# Patient Record
Sex: Female | Born: 1945
Health system: Southern US, Community
[De-identification: ages and names within clinical notes are randomized; demographics above are authoritative.]

## PROBLEM LIST (undated history)

## (undated) DIAGNOSIS — R011 Cardiac murmur, unspecified: Secondary | ICD-10-CM

## (undated) DIAGNOSIS — E785 Hyperlipidemia, unspecified: Secondary | ICD-10-CM

## (undated) DIAGNOSIS — Z9289 Personal history of other medical treatment: Secondary | ICD-10-CM

## (undated) DIAGNOSIS — M8580 Other specified disorders of bone density and structure, unspecified site: Secondary | ICD-10-CM

## (undated) DIAGNOSIS — M1990 Unspecified osteoarthritis, unspecified site: Secondary | ICD-10-CM

## (undated) DIAGNOSIS — T7840XA Allergy, unspecified, initial encounter: Secondary | ICD-10-CM

## (undated) DIAGNOSIS — I10 Essential (primary) hypertension: Secondary | ICD-10-CM

## (undated) HISTORY — DX: Personal history of other medical treatment: Z92.89

## (undated) HISTORY — DX: Essential (primary) hypertension: I10

## (undated) HISTORY — DX: Hyperlipidemia, unspecified: E78.5

## (undated) HISTORY — DX: Cardiac murmur, unspecified: R01.1

## (undated) HISTORY — DX: Unspecified osteoarthritis, unspecified site: M19.90

## (undated) HISTORY — DX: Other specified disorders of bone density and structure, unspecified site: M85.80

## (undated) HISTORY — PX: COLONOSCOPY: SHX174

## (undated) HISTORY — DX: Allergy, unspecified, initial encounter: T78.40XA

---

## 1968-08-15 DIAGNOSIS — Z9289 Personal history of other medical treatment: Secondary | ICD-10-CM

## 1968-08-15 HISTORY — DX: Personal history of other medical treatment: Z92.89

## 1977-08-15 HISTORY — PX: TUBAL LIGATION: SHX77

## 1998-10-26 ENCOUNTER — Encounter: Admission: RE | Admit: 1998-10-26 | Discharge: 1998-10-26 | Payer: Self-pay | Admitting: Family Medicine

## 1999-10-14 ENCOUNTER — Encounter: Admission: RE | Admit: 1999-10-14 | Discharge: 1999-10-14 | Payer: Self-pay | Admitting: Family Medicine

## 1999-11-15 ENCOUNTER — Encounter: Admission: RE | Admit: 1999-11-15 | Discharge: 1999-11-15 | Payer: Self-pay | Admitting: Family Medicine

## 1999-12-30 ENCOUNTER — Encounter: Admission: RE | Admit: 1999-12-30 | Discharge: 1999-12-30 | Payer: Self-pay | Admitting: *Deleted

## 2000-01-03 ENCOUNTER — Encounter: Admission: RE | Admit: 2000-01-03 | Discharge: 2000-01-03 | Payer: Self-pay | Admitting: Family Medicine

## 2000-01-03 ENCOUNTER — Encounter: Payer: Self-pay | Admitting: Family Medicine

## 2000-12-27 ENCOUNTER — Encounter: Admission: RE | Admit: 2000-12-27 | Discharge: 2000-12-27 | Payer: Self-pay | Admitting: Family Medicine

## 2001-01-10 ENCOUNTER — Encounter: Admission: RE | Admit: 2001-01-10 | Discharge: 2001-01-10 | Payer: Self-pay | Admitting: Family Medicine

## 2001-01-10 ENCOUNTER — Encounter: Payer: Self-pay | Admitting: Sports Medicine

## 2002-01-11 ENCOUNTER — Encounter: Admission: RE | Admit: 2002-01-11 | Discharge: 2002-01-11 | Payer: Self-pay | Admitting: Family Medicine

## 2002-01-15 ENCOUNTER — Encounter: Admission: RE | Admit: 2002-01-15 | Discharge: 2002-01-15 | Payer: Self-pay | Admitting: Family Medicine

## 2002-01-15 ENCOUNTER — Ambulatory Visit (HOSPITAL_COMMUNITY): Admission: RE | Admit: 2002-01-15 | Discharge: 2002-01-15 | Payer: Self-pay | Admitting: Family Medicine

## 2002-02-07 ENCOUNTER — Encounter: Admission: RE | Admit: 2002-02-07 | Discharge: 2002-02-07 | Payer: Self-pay | Admitting: Sports Medicine

## 2002-02-07 ENCOUNTER — Encounter: Payer: Self-pay | Admitting: Sports Medicine

## 2002-03-14 ENCOUNTER — Encounter: Admission: RE | Admit: 2002-03-14 | Discharge: 2002-03-14 | Payer: Self-pay | Admitting: Family Medicine

## 2003-02-26 ENCOUNTER — Encounter: Admission: RE | Admit: 2003-02-26 | Discharge: 2003-02-26 | Payer: Self-pay | Admitting: Family Medicine

## 2003-02-26 ENCOUNTER — Encounter: Payer: Self-pay | Admitting: Family Medicine

## 2003-03-05 ENCOUNTER — Encounter: Admission: RE | Admit: 2003-03-05 | Discharge: 2003-03-05 | Payer: Self-pay | Admitting: Sports Medicine

## 2003-03-05 ENCOUNTER — Encounter: Payer: Self-pay | Admitting: Sports Medicine

## 2005-12-16 ENCOUNTER — Ambulatory Visit: Payer: Self-pay | Admitting: Family Medicine

## 2006-03-22 ENCOUNTER — Other Ambulatory Visit: Admission: RE | Admit: 2006-03-22 | Discharge: 2006-03-22 | Payer: Self-pay | Admitting: Family Medicine

## 2006-03-22 ENCOUNTER — Encounter (INDEPENDENT_AMBULATORY_CARE_PROVIDER_SITE_OTHER): Payer: Self-pay | Admitting: Specialist

## 2006-03-22 ENCOUNTER — Ambulatory Visit: Payer: Self-pay | Admitting: Family Medicine

## 2006-03-22 LAB — CONVERTED CEMR LAB: Pap Smear: NORMAL

## 2006-04-04 ENCOUNTER — Encounter: Admission: RE | Admit: 2006-04-04 | Discharge: 2006-04-04 | Payer: Self-pay | Admitting: Family Medicine

## 2006-04-20 ENCOUNTER — Ambulatory Visit: Payer: Self-pay | Admitting: Family Medicine

## 2006-06-12 ENCOUNTER — Ambulatory Visit: Payer: Self-pay | Admitting: Family Medicine

## 2007-01-29 ENCOUNTER — Encounter: Payer: Self-pay | Admitting: Family Medicine

## 2007-04-09 ENCOUNTER — Encounter: Admission: RE | Admit: 2007-04-09 | Discharge: 2007-04-09 | Payer: Self-pay | Admitting: Family Medicine

## 2007-04-11 ENCOUNTER — Encounter (INDEPENDENT_AMBULATORY_CARE_PROVIDER_SITE_OTHER): Payer: Self-pay | Admitting: *Deleted

## 2007-04-17 ENCOUNTER — Encounter: Payer: Self-pay | Admitting: Family Medicine

## 2007-04-17 DIAGNOSIS — R011 Cardiac murmur, unspecified: Secondary | ICD-10-CM

## 2007-04-17 DIAGNOSIS — M8580 Other specified disorders of bone density and structure, unspecified site: Secondary | ICD-10-CM

## 2007-04-17 DIAGNOSIS — Z87898 Personal history of other specified conditions: Secondary | ICD-10-CM

## 2007-04-17 DIAGNOSIS — J309 Allergic rhinitis, unspecified: Secondary | ICD-10-CM | POA: Insufficient documentation

## 2007-05-18 ENCOUNTER — Ambulatory Visit: Payer: Self-pay | Admitting: Family Medicine

## 2007-05-21 LAB — CONVERTED CEMR LAB
ALT: 15 units/L (ref 0–35)
AST: 19 units/L (ref 0–37)
Albumin: 4.6 g/dL (ref 3.5–5.2)
Alkaline Phosphatase: 45 units/L (ref 39–117)
Basophils Absolute: 0 10*3/uL (ref 0.0–0.1)
Basophils Relative: 0.7 % (ref 0.0–1.0)
Bilirubin, Direct: 0.1 mg/dL (ref 0.0–0.3)
Chloride: 106 meq/L (ref 96–112)
Eosinophils Absolute: 0 10*3/uL (ref 0.0–0.6)
Eosinophils Relative: 0.8 % (ref 0.0–5.0)
GFR calc Af Amer: 94 mL/min
GFR calc non Af Amer: 78 mL/min
HCT: 36.6 % (ref 36.0–46.0)
HDL: 46.6 mg/dL (ref >39.0)
Lymphocytes Relative: 35 % (ref 12.0–46.0)
MCHC: 34.4 g/dL (ref 30.0–36.0)
MCV: 90.1 fL (ref 78.0–100.0)
Monocytes Absolute: 0.3 10*3/uL (ref 0.2–0.7)
Neutro Abs: 1.9 10*3/uL (ref 1.4–7.7)
Neutrophils Relative %: 53.5 % (ref 43.0–77.0)
RBC: 4.06 M/uL (ref 3.87–5.11)
Sodium: 143 meq/L (ref 135–145)
Total CHOL/HDL Ratio: 4.5
Triglycerides: 108 mg/dL (ref 0–149)
VLDL: 22 mg/dL (ref 0–40)

## 2007-06-05 ENCOUNTER — Ambulatory Visit: Payer: Self-pay | Admitting: Family Medicine

## 2007-06-06 ENCOUNTER — Encounter (INDEPENDENT_AMBULATORY_CARE_PROVIDER_SITE_OTHER): Payer: Self-pay | Admitting: *Deleted

## 2008-04-28 ENCOUNTER — Encounter: Admission: RE | Admit: 2008-04-28 | Discharge: 2008-04-28 | Payer: Self-pay | Admitting: Family Medicine

## 2008-06-24 ENCOUNTER — Ambulatory Visit: Payer: Self-pay | Admitting: Family Medicine

## 2008-06-24 DIAGNOSIS — E785 Hyperlipidemia, unspecified: Secondary | ICD-10-CM

## 2008-06-27 ENCOUNTER — Encounter: Payer: Self-pay | Admitting: Family Medicine

## 2008-06-27 ENCOUNTER — Encounter: Admission: RE | Admit: 2008-06-27 | Discharge: 2008-06-27 | Payer: Self-pay | Admitting: Family Medicine

## 2008-07-01 ENCOUNTER — Ambulatory Visit: Payer: Self-pay | Admitting: Family Medicine

## 2008-07-02 LAB — CONVERTED CEMR LAB
Alkaline Phosphatase: 29 units/L — ABNORMAL LOW (ref 39–117)
BUN: 16 mg/dL (ref 6–23)
Basophils Absolute: 0 10*3/uL (ref 0.0–0.1)
Basophils Relative: 0.4 % (ref 0.0–3.0)
Calcium: 9.3 mg/dL (ref 8.4–10.5)
Chloride: 108 meq/L (ref 96–112)
Cholesterol: 201 mg/dL (ref 0–200)
Direct LDL: 121.5 mg/dL
Eosinophils Relative: 1.7 % (ref 0.0–5.0)
GFR calc non Af Amer: 68 mL/min
Lymphocytes Relative: 44.2 % (ref 12.0–46.0)
MCV: 92.6 fL (ref 78.0–100.0)
Monocytes Absolute: 0.4 10*3/uL (ref 0.1–1.0)
Neutrophils Relative %: 41.8 % — ABNORMAL LOW (ref 43.0–77.0)
RDW: 12.5 % (ref 11.5–14.6)
TSH: 1.25 microintl units/mL (ref 0.35–5.50)
Total Bilirubin: 0.8 mg/dL (ref 0.3–1.2)
VLDL: 18 mg/dL (ref 0–40)

## 2008-07-04 ENCOUNTER — Ambulatory Visit: Payer: Self-pay | Admitting: Family Medicine

## 2009-06-03 ENCOUNTER — Encounter: Admission: RE | Admit: 2009-06-03 | Discharge: 2009-06-03 | Payer: Self-pay | Admitting: Family Medicine

## 2009-06-05 ENCOUNTER — Encounter (INDEPENDENT_AMBULATORY_CARE_PROVIDER_SITE_OTHER): Payer: Self-pay | Admitting: *Deleted

## 2009-07-15 ENCOUNTER — Other Ambulatory Visit: Admission: RE | Admit: 2009-07-15 | Discharge: 2009-07-15 | Payer: Self-pay | Admitting: Family Medicine

## 2009-07-15 ENCOUNTER — Ambulatory Visit: Payer: Self-pay | Admitting: Family Medicine

## 2009-07-31 ENCOUNTER — Encounter: Payer: Self-pay | Admitting: Family Medicine

## 2009-08-12 ENCOUNTER — Encounter (INDEPENDENT_AMBULATORY_CARE_PROVIDER_SITE_OTHER): Payer: Self-pay | Admitting: *Deleted

## 2010-06-07 ENCOUNTER — Encounter: Admission: RE | Admit: 2010-06-07 | Discharge: 2010-06-07 | Payer: Self-pay | Admitting: Family Medicine

## 2010-06-07 LAB — HM MAMMOGRAPHY: HM Mammogram: NEGATIVE

## 2010-06-09 ENCOUNTER — Encounter (INDEPENDENT_AMBULATORY_CARE_PROVIDER_SITE_OTHER): Payer: Self-pay | Admitting: *Deleted

## 2010-07-15 ENCOUNTER — Ambulatory Visit: Payer: Self-pay | Admitting: Oncology

## 2010-07-16 ENCOUNTER — Telehealth (INDEPENDENT_AMBULATORY_CARE_PROVIDER_SITE_OTHER): Payer: Self-pay | Admitting: *Deleted

## 2010-07-20 ENCOUNTER — Ambulatory Visit: Payer: Self-pay | Admitting: Family Medicine

## 2010-07-20 LAB — CONVERTED CEMR LAB
ALT: 16 units/L (ref 0–35)
AST: 25 units/L (ref 0–37)
BUN: 20 mg/dL (ref 6–23)
Chloride: 104 meq/L (ref 96–112)
Cholesterol: 212 mg/dL — ABNORMAL HIGH (ref 0–200)
Creatinine, Ser: 0.8 mg/dL (ref 0.4–1.2)
Eosinophils Absolute: 0.1 10*3/uL (ref 0.0–0.7)
Eosinophils Relative: 2 % (ref 0–5)
GFR calc non Af Amer: 81.44 mL/min (ref >60.00)
HDL: 43.4 mg/dL (ref >39.00)
Lymphocytes Relative: 61 % — ABNORMAL HIGH (ref 12–46)
MCHC: 33.1 g/dL (ref 30.0–36.0)
Monocytes Relative: 12 % (ref 3–12)
Platelets: 292 10*3/uL (ref 150–400)
Potassium: 4.6 meq/L (ref 3.5–5.1)
RDW: 13.8 % (ref 11.5–15.5)
Sodium: 139 meq/L (ref 135–145)
TSH: 1.35 microintl units/mL (ref 0.35–5.50)
Total Bilirubin: 0.5 mg/dL (ref 0.3–1.2)
VLDL: 29.8 mg/dL (ref 0.0–40.0)

## 2010-07-21 ENCOUNTER — Telehealth: Payer: Self-pay | Admitting: Family Medicine

## 2010-07-23 ENCOUNTER — Ambulatory Visit: Payer: Self-pay | Admitting: Family Medicine

## 2010-07-23 DIAGNOSIS — D709 Neutropenia, unspecified: Secondary | ICD-10-CM

## 2010-07-29 ENCOUNTER — Encounter: Payer: Self-pay | Admitting: Family Medicine

## 2010-07-29 ENCOUNTER — Ambulatory Visit: Payer: Self-pay | Admitting: Oncology

## 2010-08-15 ENCOUNTER — Ambulatory Visit: Payer: Self-pay | Admitting: Oncology

## 2010-08-20 ENCOUNTER — Other Ambulatory Visit: Payer: Self-pay | Admitting: Family Medicine

## 2010-08-20 ENCOUNTER — Ambulatory Visit
Admission: RE | Admit: 2010-08-20 | Discharge: 2010-08-20 | Payer: Self-pay | Source: Home / Self Care | Attending: Family Medicine | Admitting: Family Medicine

## 2010-08-20 LAB — FECAL OCCULT BLOOD, IMMUNOCHEMICAL: Fecal Occult Bld: NEGATIVE

## 2010-08-20 LAB — FECAL OCCULT BLOOD, GUAIAC: Fecal Occult Blood: NEGATIVE

## 2010-08-23 ENCOUNTER — Encounter: Payer: Self-pay | Admitting: Family Medicine

## 2010-09-04 ENCOUNTER — Other Ambulatory Visit: Payer: Self-pay | Admitting: Family Medicine

## 2010-09-04 DIAGNOSIS — Z780 Asymptomatic menopausal state: Secondary | ICD-10-CM

## 2010-09-12 LAB — CONVERTED CEMR LAB
ALT: 17 units/L (ref 0–35)
Albumin: 4.5 g/dL (ref 3.5–5.2)
Alkaline Phosphatase: 35 units/L — ABNORMAL LOW (ref 39–117)
Basophils Absolute: 0 10*3/uL (ref 0.0–0.1)
Cholesterol: 203 mg/dL — ABNORMAL HIGH (ref 0–200)
Direct LDL: 143.2 mg/dL
Eosinophils Absolute: 0 10*3/uL (ref 0.0–0.7)
Glucose, Bld: 97 mg/dL (ref 70–99)
HCT: 36.7 % (ref 36.0–46.0)
HDL: 49.2 mg/dL (ref >39.00)
Hemoglobin: 12.6 g/dL (ref 12.0–15.0)
Lymphs Abs: 1.5 10*3/uL (ref 0.7–4.0)
MCHC: 34.3 g/dL (ref 30.0–36.0)
MCV: 93.5 fL (ref 78.0–100.0)
Monocytes Absolute: 0.4 10*3/uL (ref 0.1–1.0)
Neutro Abs: 1.9 10*3/uL (ref 1.4–7.7)
Potassium: 4 meq/L (ref 3.5–5.1)
RBC: 3.93 M/uL (ref 3.87–5.11)
RDW: 12.5 % (ref 11.5–14.6)
Sodium: 139 meq/L (ref 135–145)
Total Bilirubin: 0.8 mg/dL (ref 0.3–1.2)
Total CHOL/HDL Ratio: 4
Triglycerides: 101 mg/dL (ref 0.0–149.0)

## 2010-09-14 NOTE — Progress Notes (Signed)
----   Converted from flag ---- ---- 07/15/2010 9:49 PM, Colon Flattery Tower MD wrote: please check wellness, lipid and vit D for v70.0 and 733.0 thanks  ---- 07/14/2010 10:54 AM, Liane Comber CMA (AAMA) wrote: Lab orders please! Good Morning! This pt is scheduled for cpx labs Tuesday, which labs to draw and dx codes to use? Thanks Tasha ------------------------------

## 2010-09-14 NOTE — Letter (Signed)
Summary: Results Follow up Letter  [place] at [facility]  [address] [place], [place] [address]   Phone: [phone]  Fax: [phone]    06/09/2010 MRN: [id number]    Melanie Marshall [address] [place], [place]  [address]    Dear [name],  The following are the results of your recent test(s):  Test         Result    Pap Smear:        Normal _____  Not Normal _____ Comments: ______________________________________________________ Cholesterol: LDL(Bad cholesterol):         Your goal is less than:         HDL (Good cholesterol):       Your goal is more than: Comments:  ______________________________________________________ Mammogram:        Normal __X___  Not Normal _____ Comments:  Yearly follow up is recommended.   ___________________________________________________________________ Hemoccult:        Normal _____  Not normal _______ Comments:    _____________________________________________________________________ Other Tests:    We routinely do not discuss normal results over the telephone.  If you desire a copy of the results, or you have any questions about this information we can discuss them at your next office visit.   Sincerely,   Marne A. Milinda Antis, M.D.  MAT:lsf

## 2010-09-14 NOTE — Letter (Signed)
Summary: [name]: Immunoassay Fecal Occult Blood (iFOB) Order Form  [place] at [facility]  [address] [place], [place] [address]   Phone: [phone]  Fax: [phone]      [facility]: Immunoassay Fecal Occult Blood (iFOB) Order Form   July 23, 2010 MRN: [id number]   [facility] Melanie Marshall 07-25-1946   Physicican Name:___Tower______________________  Diagnosis Code:_________V76.41_________________      [name] MD

## 2010-09-14 NOTE — Progress Notes (Signed)
Summary: ABN path review results  Phone Note Other Incoming   Caller: [facility] Summary of Call: called to make you aware of abn path review results. They are resulted in pt's chart. Initial call taken by: Liane Comber CMA Duncan Dull),  July 21, 2010 10:51 AM  Follow-up for Phone Call        thanks for the heads up -- will disc with pt at f/u on dec 9th- I appreciate it  Follow-up by: [name] MD,  July 21, 2010 11:04 AM

## 2010-09-16 NOTE — Letter (Signed)
Summary: Results Follow up Letter  [place] at [facility]  [address] [place], [place] [address]   Phone: [phone]  Fax: [phone]    08/23/2010 MRN: [id number]    Chandy Riggio [address] [place], [place]  [address]    Dear Ms. Coble,  The following are the results of your recent test(s):  Test         Result    Pap Smear:        Normal _____  Not Normal _____ Comments: ______________________________________________________ Cholesterol: LDL(Bad cholesterol):         Your goal is less than:         HDL (Good cholesterol):       Your goal is more than: Comments:  ______________________________________________________ Mammogram:        Normal _____  Not Normal _____ Comments:  ___________________________________________________________________ Hemoccult:        Normal __X___  Not normal _______ Comments:Stool card is negative.    _____________________________________________________________________ Other Tests:    We routinely do not discuss normal results over the telephone.  If you desire a copy of the results, or you have any questions about this information we can discuss them at your next office visit.   Sincerely,    Idamae Schuller Dawid Dupriest,MD  MT/ri

## 2010-09-16 NOTE — Assessment & Plan Note (Signed)
Summary: CPX/CLE   Vital Signs:  Patient profile:   65 year old female Height:      59.5 inches Weight:      129.50 pounds BMI:     25.81 Temp:     98 degrees F oral Pulse rate:   76 / minute Pulse rhythm:   regular BP sitting:   118 / 76  (left arm) Cuff size:   regular  Vitals Entered By: Lewanda Rife LPN (July 23, 2010 2:32 PM) CC: CPX   History of Present Illness: here for health mt exam and f/u of chronic health problems   has been feeling good  going on mission trip in feb    wt is down 4 lb-- is eating a healthy diet   bp good 118/76  osteopenia 11/09 - due for dexa on fosamax  -- has been on it 3 years  ca and D D level better at 40-- is taking more than she used to -- unsure ( 3 sources ) -- good about taking it   chol improved withLDL 135 from the 140s diet -- stays away from fried foods and red meat  HDL 43 regular exercise   wbc is 3.3- not unusual for her but path did eval and dx with abs neutropenia without app cause pmn % is down significantly   other labs ok   pap 12/10 normal no hx of abn paps  no symptoms or new sexual partners   mam 10/11- normal  self exam- no lumps   colon screen  - has not had colonosc  Td08  flu shot - got that today    Allergies (verified): No Known Drug Allergies  Past History:  Past Medical History: Last updated: 06/24/2008 Allergic rhinitis Osteopenia hyperlipidemia   Past Surgical History: Last updated: 07/03/2008 Tubal ligation (1979) Blood transfusion in childhood Flex sig (2002) Dexa- osteopenia (03/2006) Dexa0 osteopenia - stable to slt imp (11/09)  Family History: Last updated: 07/23/2010 Father: cholesterol, ? HTN, macular degeneration Mother: DM, heart problems, ? 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 HTN, macular degeneration Mother: DM, heart problems, ? OP, platelet problems  Siblings: brother with HTN PGM breast ca  Review of Systems General:  Denies chills, fatigue, fever, loss of appetite, and malaise. Eyes:  Denies blurring and eye irritation. ENT:  Denies nasal congestion and postnasal drainage. CV:  Denies chest pain or discomfort, palpitations, shortness of breath with exertion, and swelling of feet. Resp:  Denies cough and wheezing. GI:  Denies abdominal pain, constipation, diarrhea, nausea, and vomiting. GU:  Denies dysuria and urinary frequency. MS:  Denies joint redness, joint swelling, and stiffness. Derm:  Denies itching, lesion(s), poor wound healing, and rash. Neuro:  Denies numbness and tingling. Psych:  Denies anxiety and depression. Endo:  Denies cold intolerance, excessive thirst, excessive urination, and heat intolerance. Heme:  Denies abnormal bruising and bleeding.  Physical Exam  General:  Well-developed,well-nourished,in no acute distress; alert,appropriate and cooperative throughout examination Head:  normocephalic, atraumatic, and no abnormalities observed.   Eyes:  vision grossly intact, pupils equal, pupils round, and pupils reactive to light.  no conjunctival pallor, injection or icterus  Ears:  R  ear normal and L ear normal.   Nose:  no nasal discharge.   Mouth:  pharynx pink and moist.   Neck:  supple with full rom and no masses or thyromegally, no JVD or carotid bruit  Chest Wall:  No deformities, masses, or tenderness noted. Breasts:  No mass, nodules, thickening, tenderness, bulging, retraction, inflamation, nipple discharge or skin changes noted.   Lungs:  Normal respiratory effort, chest expands symmetrically. Lungs are clear to auscultation, no crackles or  wheezes. Heart:  Normal rate and regular rhythm. S1 and S2 normal without gallop, murmur, click, rub or other extra sounds. Abdomen:  Bowel sounds positive,abdomen soft and non-tender without masses, organomegaly or hernias noted. no renal bruits  Msk:  No deformity or scoliosis noted of thoracic or lumbar spine.  no acute joint changes Pulses:  R and L carotid,radial,femoral,dorsalis pedis and posterior tibial pulses are full and equal bilaterally Extremities:  No clubbing, cyanosis, edema, or deformity noted with normal full range of motion of all joints.   Neurologic:  sensation intact to light touch, gait normal, and DTRs symmetrical and normal.  no tremor Skin:  Intact without suspicious lesions or rashes no jaundice or pallor Cervical Nodes:  No lymphadenopathy noted Axillary Nodes:  No palpable lymphadenopathy Inguinal Nodes:  No significant adenopathy Psych:  normal affect, talkative and pleasant    Impression & Recommendations:  Problem # 1:  HEALTH MAINTENANCE EXAM (ICD-V70.0) Assessment Comment Only reviewed health habits including diet, exercise and skin cancer prevention reviewed health maintenance list and family history rev labs in detail  Problem # 2:  HYPERLIPIDEMIA (ICD-272.4) Assessment: Improved  controlling this with diet  rev low sat fat diet and labs some imp this year  Labs Reviewed: SGOT: 25 (07/20/2010)   SGPT: 16 (07/20/2010)   HDL:43.40 (07/20/2010), 49.20 (07/15/2009)  LDL:DEL (07/01/2008), DEL (05/18/2007)  Chol:212 (07/20/2010), 203 (07/15/2009)  Trig:149.0 (07/20/2010), 101.0 (07/15/2009)  Problem # 3:  OSTEOPENIA (ICD-733.90) Assessment: Unchanged check dexa 3rd year of fosamax rev ca and d d level ok Her updated medication list for this problem includes:    Fosamax 70 Mg Tabs (Alendronate sodium) .[name] Kitchen... 1 by mouth once per  week as directed  Orders: Radiology Referral (Radiology)  Problem # 4:  NEUTROPENIA UNSPECIFIED  (ICD-288.00) Assessment: New this is new wbc has always been slt low but now low counts/ % of pmns no symptoms  ref for heme eval  Orders: Hematology Referral (Hematology)  Complete Medication List: 1)  Multi-vitamin Tabs (Multiple vitamin) .... Take by mouth as directed 2)  Calcium 1640 Mg  .... Take by mouth daily 3)  Fosamax 70 Mg Tabs (Alendronate sodium) .[name] Kitchen.. 1 by mouth once per  week as directed  Other Orders: Admin 1st Vaccine (04540) Flu Vaccine 61yrs + (98119)  Patient Instructions: 1)  please do stool card for colon cancer screening  2)  we will ref for bone density check at check out  3)  keep watching diet for cholesterol  4)  we will do hematology referral at check out  Prescriptions: FOSAMAX 70 MG  TABS (ALENDRONATE SODIUM) 1 by mouth once per  week as directed  #12 x 3   Entered and Authorized by:   [name] MD   Signed by:   [name] MD on 07/23/2010   Method used:   Electronically to        [facility]  [address] [phone] (retail)       [address]  [place], [place]  [address]       Ph: [phone] or [phone]       Fax: [phone]   RxID:   [id number]    Orders Added: 1)  Admin 1st Vaccine [90471] 2)  Flu Vaccine 42yrs + [74259] 3)  Hematology Referral [Hematology] 4)  Radiology Referral [Radiology] 5)  Est. Patient 40-64 years [99396] 6)  Est. Patient Level II [56387]    Current Allergies (reviewed today): No known allergies Flu Vaccine Consent Questions     Do you have a history of severe allergic reactions to this vaccine? no    Any prior history of allergic reactions to egg and/or gelatin? no    Do you have a sensitivity to the preservative Thimersol? no    Do you have a past history of Guillan-Barre Syndrome? no    Do you currently have an acute febrile illness? no    Have you ever had a severe reaction to latex? no    Vaccine information given and explained to patient? yes    Are you currently pregnant? no     Lot Number:[id number]   Exp Date:02/12/2011   Site Given  Left Deltoid IM Lewanda Rife LPN  July 23, 2010 2:43 PM  .[email or web address]

## 2010-09-16 NOTE — Letter (Signed)
Summary: [facility]  [facility]   Imported By: Maryln Gottron 09/06/2010 14:09:34  _____________________________________________________________________  External Attachment:    Type:   Image     Comment:   External Document

## 2010-10-11 ENCOUNTER — Encounter: Payer: Self-pay | Admitting: Family Medicine

## 2010-10-11 ENCOUNTER — Ambulatory Visit: Payer: Self-pay | Admitting: Oncology

## 2010-10-14 ENCOUNTER — Ambulatory Visit: Payer: Self-pay | Admitting: Oncology

## 2010-10-26 NOTE — Letter (Signed)
Summary: [facility]   [facility]   Imported By: Kassie Mends 10/19/2010 09:17:41  _____________________________________________________________________  External Attachment:    Type:   Image     Comment:   External Document

## 2010-10-27 ENCOUNTER — Other Ambulatory Visit: Payer: Self-pay

## 2010-12-22 ENCOUNTER — Ambulatory Visit
Admission: RE | Admit: 2010-12-22 | Discharge: 2010-12-22 | Disposition: A | Discharge status: Home / Self Care | Payer: BC Managed Care – PPO | Source: Ambulatory Visit | Attending: Family Medicine | Admitting: Family Medicine

## 2010-12-22 DIAGNOSIS — Z780 Asymptomatic menopausal state: Secondary | ICD-10-CM

## 2011-01-05 ENCOUNTER — Encounter: Payer: Self-pay | Admitting: Family Medicine

## 2011-01-07 ENCOUNTER — Encounter: Payer: Self-pay | Admitting: *Deleted

## 2011-06-13 ENCOUNTER — Other Ambulatory Visit: Payer: Self-pay | Admitting: Family Medicine

## 2011-06-13 DIAGNOSIS — Z1231 Encounter for screening mammogram for malignant neoplasm of breast: Secondary | ICD-10-CM

## 2011-07-01 ENCOUNTER — Ambulatory Visit
Admission: RE | Admit: 2011-07-01 | Discharge: 2011-07-01 | Disposition: A | Discharge status: Home / Self Care | Payer: BC Managed Care – PPO | Source: Ambulatory Visit | Attending: Family Medicine | Admitting: Family Medicine

## 2011-07-01 DIAGNOSIS — Z1231 Encounter for screening mammogram for malignant neoplasm of breast: Secondary | ICD-10-CM

## 2011-07-12 ENCOUNTER — Encounter: Payer: Self-pay | Admitting: *Deleted

## 2011-07-19 ENCOUNTER — Other Ambulatory Visit: Payer: Self-pay | Admitting: Family Medicine

## 2011-09-21 ENCOUNTER — Telehealth: Payer: Self-pay | Admitting: Family Medicine

## 2011-09-21 ENCOUNTER — Other Ambulatory Visit (INDEPENDENT_AMBULATORY_CARE_PROVIDER_SITE_OTHER): Payer: BC Managed Care – PPO

## 2011-09-21 DIAGNOSIS — E785 Hyperlipidemia, unspecified: Secondary | ICD-10-CM

## 2011-09-21 DIAGNOSIS — M949 Disorder of cartilage, unspecified: Secondary | ICD-10-CM

## 2011-09-21 DIAGNOSIS — M899 Disorder of bone, unspecified: Secondary | ICD-10-CM

## 2011-09-21 DIAGNOSIS — D709 Neutropenia, unspecified: Secondary | ICD-10-CM

## 2011-09-21 LAB — LIPID PANEL
Cholesterol: 212 mg/dL — ABNORMAL HIGH (ref 0–200)
HDL: 54 mg/dL (ref >39.00)
Total CHOL/HDL Ratio: 4
Triglycerides: 111 mg/dL (ref 0.0–149.0)
VLDL: 22.2 mg/dL (ref 0.0–40.0)

## 2011-09-21 LAB — CBC WITH DIFFERENTIAL/PLATELET
Eosinophils Relative: 1.3 % (ref 0.0–5.0)
HCT: 35 % — ABNORMAL LOW (ref 36.0–46.0)
Hemoglobin: 12 g/dL (ref 12.0–15.0)
Lymphocytes Relative: 42.5 % (ref 12.0–46.0)
Lymphs Abs: 1.8 10*3/uL (ref 0.7–4.0)
Monocytes Relative: 12.2 % — ABNORMAL HIGH (ref 3.0–12.0)
Neutro Abs: 1.8 10*3/uL (ref 1.4–7.7)
Neutrophils Relative %: 43.5 % (ref 43.0–77.0)
RDW: 13.4 % (ref 11.5–14.6)
WBC: 4.2 10*3/uL — ABNORMAL LOW (ref 4.5–10.5)

## 2011-09-21 LAB — COMPREHENSIVE METABOLIC PANEL WITH GFR
ALT: 17 U/L (ref 0–35)
AST: 25 U/L (ref 0–37)
Albumin: 4.4 g/dL (ref 3.5–5.2)
Alkaline Phosphatase: 31 U/L — ABNORMAL LOW (ref 39–117)
BUN: 27 mg/dL — ABNORMAL HIGH (ref 6–23)
CO2: 27 meq/L (ref 19–32)
Calcium: 9.5 mg/dL (ref 8.4–10.5)
Chloride: 106 meq/L (ref 96–112)
Creatinine, Ser: 0.9 mg/dL (ref 0.4–1.2)
GFR: 66.76 mL/min (ref >60.00)
Glucose, Bld: 91 mg/dL (ref 70–99)
Potassium: 3.8 meq/L (ref 3.5–5.1)
Sodium: 140 meq/L (ref 135–145)
Total Bilirubin: 0.7 mg/dL (ref 0.3–1.2)
Total Protein: 7.5 g/dL (ref 6.0–8.3)

## 2011-09-21 LAB — LDL CHOLESTEROL, DIRECT: Direct LDL: 143.8 mg/dL

## 2011-09-21 NOTE — Telephone Encounter (Signed)
Message copied by Judy Pimple on Wed Sep 21, 2011  8:53 AM ------      Message from: Alvina Chou      Created: Tue Sep 13, 2011  3:21 PM      Regarding: Lab orders for 2.6.13       Patient is scheduled for CPX labs, please order future labs, Thanks , Camelia Eng

## 2011-09-22 LAB — VITAMIN D 25 HYDROXY (VIT D DEFICIENCY, FRACTURES): Vit D, 25-Hydroxy: 34 ng/mL (ref 30–89)

## 2011-09-23 ENCOUNTER — Encounter: Payer: Self-pay | Admitting: Family Medicine

## 2011-09-23 ENCOUNTER — Ambulatory Visit (INDEPENDENT_AMBULATORY_CARE_PROVIDER_SITE_OTHER): Payer: Medicare Other | Admitting: Family Medicine

## 2011-09-23 ENCOUNTER — Encounter: Payer: Self-pay | Admitting: Gastroenterology

## 2011-09-23 VITALS — BP 118/76 | HR 72 | Temp 98.4°F | Ht 59.75 in | Wt 132.5 lb

## 2011-09-23 DIAGNOSIS — M949 Disorder of cartilage, unspecified: Secondary | ICD-10-CM

## 2011-09-23 DIAGNOSIS — Z1211 Encounter for screening for malignant neoplasm of colon: Secondary | ICD-10-CM

## 2011-09-23 DIAGNOSIS — E785 Hyperlipidemia, unspecified: Secondary | ICD-10-CM

## 2011-09-23 DIAGNOSIS — Z23 Encounter for immunization: Secondary | ICD-10-CM

## 2011-09-23 DIAGNOSIS — D709 Neutropenia, unspecified: Secondary | ICD-10-CM

## 2011-09-23 DIAGNOSIS — M899 Disorder of bone, unspecified: Secondary | ICD-10-CM

## 2011-09-23 MED ORDER — ALENDRONATE SODIUM 70 MG PO TABS: 70.0000 mg | ORAL_TABLET | ORAL | Status: DC

## 2011-09-23 NOTE — Patient Instructions (Signed)
We will refer you for bone density test and colonoscopy at check out Avoid red meat/ fried foods/ egg yolks/ fatty breakfast meats/ butter, cheese and high fat dairy/ and shellfish   Pneumonia vaccine today Stay healthy- get some exercise

## 2011-09-23 NOTE — Progress Notes (Signed)
Subjective:    Patient ID: Melanie Marshall, female    DOB: 1946-08-01, 66 y.o.   MRN: [id number]  HPI Here for check up of chronic medical conditions and to review health mt list   Is doing ok overall but had a bad year   Father died in Dec 22, 2022 and mother died in 08/23/23 Has had to travel constantly  Father had pancreatic cancer - died soon after  Mother fell / broke leg/ then had MI   Has dealt with it best she can - has good support  Does not feel like she needs grief counseling at this time   bp is 118/76 and wt is up 3 lb with bmi of 26  Colon screen-has not had a colonoscopy , and is ready for one   zost- has not had the vaccine - and not interested   Flu - did get vaccine oct   Pneumovax - would like to get today   mammo 11/12- normal  Self exam - no lumps   Pap- was 12/10- does not want to get yet   dexa-- last one was over 2 years  Osteopenia - due for dexa -- at the [facility]  On fosamax -- has been on for a while--4 years  Has not taken since the end of November -- ran out of it and did not refill Is taking ca and vit D  Lipid Lab Results  Component Value Date   CHOL 212* 09/21/2011   CHOL 212* 07/20/2010   CHOL 203* 07/15/2009   Lab Results  Component Value Date   HDL 54.00 09/21/2011   HDL 43.40 07/20/2010   HDL 78.29 07/15/2009   No results found for this basename: [facility]   Lab Results  Component Value Date   TRIG 111.0 09/21/2011   TRIG 149.0 07/20/2010   TRIG 101.0 07/15/2009   Lab Results  Component Value Date   CHOLHDL 4 09/21/2011   CHOLHDL 5 07/20/2010   CHOLHDL 4 07/15/2009   Lab Results  Component Value Date   LDLDIRECT 143.8 09/21/2011   LDLDIRECT 135.8 07/20/2010   LDLDIRECT 143.2 07/15/2009   wants to work on her diet - has not eaten smartly given the bad year -- not eating regularly  Too much fast food Getting away from it now  Knows what foods to watch   Also not been exercising as much   Wbc  Lab Results  Component Value Date   WBC 4.2* 09/21/2011   HGB 12.0 09/21/2011   HCT 35.0* 09/21/2011   MCV 91.1 09/21/2011   PLT 309.0 09/21/2011   this is overall stable/ asymptomatic  Patient Active Problem List  Diagnoses  . HYPERLIPIDEMIA  . ALLERGIC RHINITIS  . OSTEOPENIA  . MURMUR  . MIGRAINES, HX OF  . NEUTROPENIA UNSPECIFIED  . Special screening for malignant neoplasms, colon   Past Medical History  Diagnosis Date  . Allergy     allergic rhinitis  . Osteopenia   . Hyperlipidemia    Past Surgical History  Procedure Date  . Tubal ligation 1979   History  Substance Use Topics  . Smoking status: Former Smoker    Quit date: 08/16/1967  . Smokeless tobacco: Not on file  . Alcohol Use: Not on file   Family History  Problem Relation Age of Onset  . Diabetes Mother   . Heart disease Mother     MI  . Hypertension Father     ??HTN  . Hyperlipidemia Father   .  Cancer Father     pancreatic cancer  . Pancreatic cancer Father   . Hypertension Brother   . Cancer Paternal Grandmother     breast CA   No Known Allergies No current outpatient prescriptions on file prior to visit.      Review of Systems Review of Systems  Constitutional: Negative for fever, appetite change, fatigue and unexpected weight change.  Eyes: Negative for pain and visual disturbance.  Respiratory: Negative for cough and shortness of breath.   Cardiovascular: Negative for cp or palpitations    Gastrointestinal: Negative for nausea, diarrhea and constipation.  Genitourinary: Negative for urgency and frequency.  Skin: Negative for pallor or rash   Neurological: Negative for weakness, light-headedness, numbness and headaches.  Hematological: Negative for adenopathy. Does not bruise/bleed easily.  Psychiatric/Behavioral: Negative for dysphoric mood. The patient is not nervous/anxious.  is dealing ok with grief and loss         Objective:   Physical Exam  Constitutional: She appears well-developed and well-nourished. No distress.    HENT:  Head: Normocephalic and atraumatic.  Right Ear: External ear normal.  Left Ear: External ear normal.  Nose: Nose normal.  Mouth/Throat: Oropharynx is clear and moist.  Eyes: Conjunctivae and EOM are normal. Pupils are equal, round, and reactive to light. No scleral icterus.  Neck: Normal range of motion. Neck supple. No JVD present. Carotid bruit is not present. No thyromegaly present.  Cardiovascular: Normal rate, regular rhythm, normal heart sounds and intact distal pulses.  Exam reveals no gallop.   Pulmonary/Chest: Effort normal and breath sounds normal. No respiratory distress. She has no wheezes.  Abdominal: Soft. Bowel sounds are normal. She exhibits no distension, no abdominal bruit and no mass. There is no tenderness.  Genitourinary: No breast swelling, tenderness or bleeding.       Breast exam: No mass, nodules, thickening, tenderness, bulging, retraction, inflamation, nipple discharge or skin changes noted.  No axillary or clavicular LA.  Chaperoned exam.    Musculoskeletal: Normal range of motion. She exhibits no edema and no tenderness.  Lymphadenopathy:    She has no cervical adenopathy.  Neurological: She is alert. She has normal reflexes. No cranial nerve deficit. She exhibits normal muscle tone. Coordination normal.  Skin: Skin is warm and dry. No rash noted. No erythema. No pallor.  Psychiatric: She has a normal mood and affect.          Assessment & Plan:

## 2011-09-26 NOTE — Assessment & Plan Note (Signed)
Disc goals for lipids and reasons to control them Rev labs with pt Rev low sat fat diet in detail  Pt will work harder on diet to get to goal If no success- consider statin

## 2011-09-26 NOTE — Assessment & Plan Note (Signed)
Pt is ready for screening colonoscopy  Ref to GI No bowel changes

## 2011-09-26 NOTE — Assessment & Plan Note (Signed)
Due for dexa- will schedule that  Rev ca and vit D intake  Will re start fosamax- 1 more year if tolerated No kyphosis  Disc imp of exercise

## 2011-09-26 NOTE — Assessment & Plan Note (Signed)
Mild/ stable and asymptomatic Will continue to monitor over time

## 2011-10-17 ENCOUNTER — Ambulatory Visit (AMBULATORY_SURGERY_CENTER): Payer: Medicare Other | Admitting: *Deleted

## 2011-10-17 VITALS — Ht 60.0 in | Wt 136.0 lb

## 2011-10-17 DIAGNOSIS — Z1211 Encounter for screening for malignant neoplasm of colon: Secondary | ICD-10-CM

## 2011-10-17 MED ORDER — PEG-KCL-NACL-NASULF-NA ASC-C 100 G PO SOLR: ORAL | Status: DC

## 2011-10-31 ENCOUNTER — Encounter: Payer: Self-pay | Admitting: Gastroenterology

## 2011-10-31 ENCOUNTER — Ambulatory Visit (AMBULATORY_SURGERY_CENTER): Payer: Medicare Other | Admitting: Gastroenterology

## 2011-10-31 VITALS — BP 124/67 | HR 63 | Temp 98.2°F | Resp 24 | Ht 60.0 in | Wt 136.0 lb

## 2011-10-31 DIAGNOSIS — K5730 Diverticulosis of large intestine without perforation or abscess without bleeding: Secondary | ICD-10-CM

## 2011-10-31 DIAGNOSIS — Z1211 Encounter for screening for malignant neoplasm of colon: Secondary | ICD-10-CM

## 2011-10-31 MED ORDER — SODIUM CHLORIDE 0.9 % IV SOLN: 500.0000 mL | INTRAVENOUS | Status: DC

## 2011-10-31 NOTE — Patient Instructions (Signed)
YOU HAD AN ENDOSCOPIC PROCEDURE TODAY AT THE [facility]: Refer to the procedure report that was given to you for any specific questions about what was found during the examination.  If the procedure report does not answer your questions, please call your gastroenterologist to clarify.  If you requested that your care partner not be given the details of your procedure findings, then the procedure report has been included in a sealed envelope for you to review at your convenience later.  YOU SHOULD EXPECT: Some feelings of bloating in the abdomen. Passage of more gas than usual.  Walking can help get rid of the air that was put into your GI tract during the procedure and reduce the bloating. If you had a lower endoscopy (such as a colonoscopy or flexible sigmoidoscopy) you may notice spotting of blood in your stool or on the toilet paper. If you underwent a bowel prep for your procedure, then you may not have a normal bowel movement for a few days.  DIET: Your first meal following the procedure should be a light meal and then it is ok to progress to your normal diet.  A half-sandwich or bowl of soup is an example of a good first meal.  Heavy or fried foods are harder to digest and may make you feel nauseous or bloated.  Likewise meals heavy in dairy and vegetables can cause extra gas to form and this can also increase the bloating.  Drink plenty of fluids but you should avoid alcoholic beverages for 24 hours.  ACTIVITY: Your care partner should take you home directly after the procedure.  You should plan to take it easy, moving slowly for the rest of the day.  You can resume normal activity the day after the procedure however you should NOT DRIVE or use heavy machinery for 24 hours (because of the sedation medicines used during the test).    SYMPTOMS TO REPORT IMMEDIATELY: A gastroenterologist can be reached at any hour.  During normal business hours, 8:30 AM to 5:00 PM Monday through Friday,  call [phone].  After hours and on weekends, please call the GI answering service at [phone] who will take a message and have the physician on call contact you.   Following lower endoscopy (colonoscopy or flexible sigmoidoscopy):  Excessive amounts of blood in the stool  Significant tenderness or worsening of abdominal pains  Swelling of the abdomen that is new, acute  Fever of 100F or higher  Following upper endoscopy (EGD)  Vomiting of blood or coffee ground material  New chest pain or pain under the shoulder blades  Painful or persistently difficult swallowing  New shortness of breath  Fever of 100F or higher  Black, tarry-looking stools  FOLLOW UP: If any biopsies were taken you will be contacted by phone or by letter within the next 1-3 weeks.  Call your gastroenterologist if you have not heard about the biopsies in 3 weeks.  Our staff will call the home number listed on your records the next business day following your procedure to check on you and address any questions or concerns that you may have at that time regarding the information given to you following your procedure. This is a courtesy call and so if there is no answer at the home number and we have not heard from you through the emergency physician on call, we will assume that you have returned to your regular daily activities without incident.  SIGNATURES/CONFIDENTIALITY: You and/or your care   partner have signed paperwork which will be entered into your electronic medical record.  These signatures attest to the fact that that the information above on your After Visit Summary has been reviewed and is understood.  Full responsibility of the confidentiality of this discharge information lies with you and/or your care-partner.   Handouts on diverticulosis and high fiber diet 

## 2011-10-31 NOTE — Op Note (Signed)
[facility] [address] [place], [place]  [address]  COLONOSCOPY PROCEDURE REPORT  PATIENT:  Melanie, Marshall  MR#:  [id number] BIRTHDATE:  1946-05-02, 65 yrs. old  GENDER:  female ENDOSCOPIST:  Barbette Hair. Arlyce Dice, MD REF. BY:  Marne A. Milinda Antis, M.D. PROCEDURE DATE:  10/31/2011 PROCEDURE:  Diagnostic Colonoscopy ASA CLASS:  Class I INDICATIONS:  Routine Risk Screening MEDICATIONS:   MAC sedation, administered by CRNA propofol 220mg IV  DESCRIPTION OF PROCEDURE:   After the risks benefits and alternatives of the procedure were thoroughly explained, informed consent was obtained.  Digital rectal exam was performed and revealed no abnormalities.   The LB [id number]-[id number] endoscope was introduced through the anus and advanced to the cecum, which was identified by both the appendix and ileocecal valve, without limitations.  The quality of the prep was good, using MoviPrep. The instrument was then slowly withdrawn as the colon was fully examined. <<PROCEDUREIMAGES>>  FINDINGS:  Scattered diverticula were found in the sigmoid to descending colon segments (see image2).  This was otherwise a normal examination of the colon (see image1 and image3). Retroflexed views in the rectum revealed no abnormalities.    The time to cecum =  1) 6.25  minutes. The scope was then withdrawn in 1) 7.0  minutes from the cecum and the procedure completed. COMPLICATIONS:  None ENDOSCOPIC IMPRESSION: 1) Diverticula, scattered in the sigmoid to descending colon segments 2) Otherwise normal examination RECOMMENDATIONS: 1) Continue current colorectal screening recommendations for "routine risk" patients with a repeat colonoscopy in 10 years. REPEAT EXAM:  In 10 year(s) for Colonoscopy.  ______________________________ Barbette Hair. Arlyce Dice, MD  CC:  n. eSIGNED:   Barbette Hair. Bernetha Anschutz at 10/31/2011 11:12 AM  Carlena Bjornstad, [phone]

## 2011-10-31 NOTE — Progress Notes (Signed)
Patient did not experience any of the following events: a burn prior to discharge; a fall within the facility; wrong site/side/patient/procedure/implant event; or a hospital transfer or hospital admission upon discharge from the facility. (G8907) Patient did not have preoperative order for IV antibiotic SSI prophylaxis. (G8918)  

## 2011-11-01 ENCOUNTER — Telehealth: Payer: Self-pay | Admitting: *Deleted

## 2011-11-01 NOTE — Telephone Encounter (Signed)
  Follow up Call-  Call back number 10/31/2011  Post procedure Call Back phone  # [phone]  Permission to leave phone message Yes     Patient questions:  Do you have a fever, pain , or abdominal swelling? no Pain Score  0 *  Have you tolerated food without any problems? yes  Have you been able to return to your normal activities? yes  Do you have any questions about your discharge instructions: Diet   no Medications  no Follow up visit  no  Do you have questions or concerns about your Care? no  Actions: * If pain score is 4 or above: No action needed, pain <4.

## 2011-11-24 ENCOUNTER — Encounter: Payer: Self-pay | Admitting: Family Medicine

## 2011-11-24 ENCOUNTER — Ambulatory Visit (INDEPENDENT_AMBULATORY_CARE_PROVIDER_SITE_OTHER): Payer: Medicare Other | Admitting: Family Medicine

## 2011-11-24 VITALS — BP 120/72 | HR 58 | Temp 97.6°F | Ht 60.0 in | Wt 132.8 lb

## 2011-11-24 DIAGNOSIS — M25519 Pain in unspecified shoulder: Secondary | ICD-10-CM

## 2011-11-24 DIAGNOSIS — S46819A Strain of other muscles, fascia and tendons at shoulder and upper arm level, unspecified arm, initial encounter: Secondary | ICD-10-CM

## 2011-11-24 DIAGNOSIS — S43499A Other sprain of unspecified shoulder joint, initial encounter: Secondary | ICD-10-CM

## 2011-11-24 MED ORDER — TIZANIDINE HCL 4 MG PO TABS: 4.0000 mg | ORAL_TABLET | Freq: Every evening | ORAL | Status: AC

## 2011-11-24 MED ORDER — DICLOFENAC SODIUM 75 MG PO TBEC: 75.0000 mg | DELAYED_RELEASE_TABLET | Freq: Two times a day (BID) | ORAL | Status: DC

## 2011-11-24 NOTE — Progress Notes (Signed)
  Patient Name: Melanie Marshall Date of Birth: 06-28-1946 Age: 66 y.o. Medical Record Number: [id number] Gender: female Date of Encounter: 11/24/2011  History of Present Illness:  Melanie Marshall is a 66 y.o. very pleasant female patient who presents with the following:  L shoulder pain, mostly in shoulder blade. Started around last week. Lifting and throwing in the dumpsters. Also trimming some shrubbery.  No pain with abduction No pain with flexion or IROM or EROM  No numbness or tingling.   Past Medical History, Surgical History, Social History, Family History, Problem List, Medications, and Allergies have been reviewed and updated if relevant.  Review of Systems:  GEN: No fevers, chills. Nontoxic. Primarily MSK c/o today. MSK: Detailed in the HPI GI: tolerating PO intake without difficulty Neuro: No numbness, parasthesias, or tingling associated. Otherwise the pertinent positives of the ROS are noted above.    Physical Examination: Filed Vitals:   11/24/11 0854  BP: 120/72  Pulse: 58  Temp: 97.6 F (36.4 C)  TempSrc: Oral  Height: 5' (1.524 m)  Weight: 132 lb 12.8 oz (60.238 kg)  SpO2: 99%    Body mass index is 25.94 kg/(m^2).   GEN: WDWN, NAD, Non-toxic, Alert & Oriented x 3 HEENT: Atraumatic, Normocephalic.  Ears and Nose: No external deformity. EXTR: No clubbing/cyanosis/edema NEURO: Normal gait.  PSYCH: Normally interactive. Conversant. Not depressed or anxious appearing.  Calm demeanor.   Shoulder: L Inspection: No muscle wasting or winging Ecchymosis/edema: neg  AC joint, scapula, clavicle: NT Cervical spine: approx 20% dec ROM in all directions, TTP posteriorly, support muscles Traps TTP Spurling's: neg Abduction: full, 5/5 Flexion: full, 5/5 IR, full, lift-off: 5/5 ER at neutral: full, 5/5 AC crossover and compression: neg Neer: neg Hawkins: neg Drop Test: neg Empty Can: neg Supraspinatus insertion: NT Bicipital groove: NT Speed's:  neg Yergason's: neg Sulcus sign: neg Scapular dyskinesis: none C5-T1 intact Sensation intact Grip 5/5   Assessment and Plan:  1. Trapezius strain  diclofenac (VOLTAREN) 75 MG EC tablet, tiZANidine (ZANAFLEX) 4 MG tablet  2. Shoulder pain  diclofenac (VOLTAREN) 75 MG EC tablet, tiZANidine (ZANAFLEX) 4 MG tablet   Think all muscle Reviewed rehab Not c/w intraarticular or cuff Posterior cervical and upper traps  Orders Today: No orders of the defined types were placed in this encounter.    Medications Today: Meds ordered this encounter  Medications  . diclofenac (VOLTAREN) 75 MG EC tablet    Sig: Take 1 tablet (75 mg total) by mouth 2 (two) times daily.    Dispense:  60 tablet    Refill:  3  . tiZANidine (ZANAFLEX) 4 MG tablet    Sig: Take 1 tablet (4 mg total) by mouth Nightly.    Dispense:  30 tablet    Refill:  2

## 2011-11-24 NOTE — Patient Instructions (Signed)
[email or web address]  Or google  "[facility]" -- then click on Videos section

## 2012-05-29 ENCOUNTER — Other Ambulatory Visit: Payer: Self-pay | Admitting: Family Medicine

## 2012-05-29 DIAGNOSIS — Z1231 Encounter for screening mammogram for malignant neoplasm of breast: Secondary | ICD-10-CM

## 2012-06-29 ENCOUNTER — Ambulatory Visit (INDEPENDENT_AMBULATORY_CARE_PROVIDER_SITE_OTHER): Payer: Medicare Other | Admitting: *Deleted

## 2012-06-29 DIAGNOSIS — Z23 Encounter for immunization: Secondary | ICD-10-CM

## 2012-06-29 DIAGNOSIS — Z2911 Encounter for prophylactic immunotherapy for respiratory syncytial virus (RSV): Secondary | ICD-10-CM

## 2012-07-02 ENCOUNTER — Ambulatory Visit
Admission: RE | Admit: 2012-07-02 | Discharge: 2012-07-02 | Disposition: A | Discharge status: Home / Self Care | Payer: Medicare Other | Source: Ambulatory Visit | Attending: Family Medicine | Admitting: Family Medicine

## 2012-07-02 DIAGNOSIS — Z1231 Encounter for screening mammogram for malignant neoplasm of breast: Secondary | ICD-10-CM

## 2012-07-06 ENCOUNTER — Other Ambulatory Visit: Payer: Self-pay | Admitting: Family Medicine

## 2012-07-06 DIAGNOSIS — R928 Other abnormal and inconclusive findings on diagnostic imaging of breast: Secondary | ICD-10-CM

## 2012-07-09 ENCOUNTER — Telehealth: Payer: Self-pay

## 2012-07-09 NOTE — Telephone Encounter (Signed)
Pt left v/m letting Dr Milinda Antis know that recheck of mammogram scheduled for 07/17/12 at 1:50pm.

## 2012-07-09 NOTE — Telephone Encounter (Signed)
Thanks

## 2012-07-17 ENCOUNTER — Ambulatory Visit
Admission: RE | Admit: 2012-07-17 | Discharge: 2012-07-17 | Disposition: A | Discharge status: Home / Self Care | Payer: Medicare Other | Source: Ambulatory Visit | Attending: Family Medicine | Admitting: Family Medicine

## 2012-07-17 DIAGNOSIS — R928 Other abnormal and inconclusive findings on diagnostic imaging of breast: Secondary | ICD-10-CM

## 2012-07-18 ENCOUNTER — Encounter: Payer: Self-pay | Admitting: *Deleted

## 2012-10-16 ENCOUNTER — Other Ambulatory Visit: Payer: Medicare Other

## 2012-10-22 ENCOUNTER — Encounter: Payer: Medicare Other | Admitting: Family Medicine

## 2012-11-14 ENCOUNTER — Telehealth: Payer: Self-pay | Admitting: Family Medicine

## 2012-11-14 DIAGNOSIS — E785 Hyperlipidemia, unspecified: Secondary | ICD-10-CM

## 2012-11-14 DIAGNOSIS — D709 Neutropenia, unspecified: Secondary | ICD-10-CM

## 2012-11-14 DIAGNOSIS — Z0000 Encounter for general adult medical examination without abnormal findings: Secondary | ICD-10-CM | POA: Insufficient documentation

## 2012-11-14 DIAGNOSIS — M949 Disorder of cartilage, unspecified: Secondary | ICD-10-CM

## 2012-11-14 NOTE — Telephone Encounter (Signed)
Message copied by Judy Pimple on Wed Nov 14, 2012  8:11 AM ------      Message from: Baldomero Lamy      Created: Mon Nov 12, 2012  8:40 AM      Regarding: Cpx labs Thurs 11/15/12       Please order  future cpx labs for pt's upcoming lab appt.      Thanks      Tasha       ------

## 2012-11-15 ENCOUNTER — Other Ambulatory Visit (INDEPENDENT_AMBULATORY_CARE_PROVIDER_SITE_OTHER): Payer: Medicare Other

## 2012-11-15 DIAGNOSIS — M899 Disorder of bone, unspecified: Secondary | ICD-10-CM

## 2012-11-15 DIAGNOSIS — Z0000 Encounter for general adult medical examination without abnormal findings: Secondary | ICD-10-CM

## 2012-11-15 DIAGNOSIS — D709 Neutropenia, unspecified: Secondary | ICD-10-CM

## 2012-11-15 DIAGNOSIS — E785 Hyperlipidemia, unspecified: Secondary | ICD-10-CM

## 2012-11-15 DIAGNOSIS — M949 Disorder of cartilage, unspecified: Secondary | ICD-10-CM

## 2012-11-15 LAB — COMPREHENSIVE METABOLIC PANEL
Albumin: 4.4 g/dL (ref 3.5–5.2)
Alkaline Phosphatase: 41 U/L (ref 39–117)
BUN: 20 mg/dL (ref 6–23)
Creatinine, Ser: 0.9 mg/dL (ref 0.4–1.2)
Glucose, Bld: 91 mg/dL (ref 70–99)
Potassium: 3.7 mEq/L (ref 3.5–5.1)

## 2012-11-15 LAB — CBC WITH DIFFERENTIAL/PLATELET
Basophils Relative: 0.6 % (ref 0.0–3.0)
Eosinophils Relative: 1.5 % (ref 0.0–5.0)
Lymphocytes Relative: 45.4 % (ref 12.0–46.0)
MCV: 88.8 fl (ref 78.0–100.0)
Monocytes Absolute: 0.4 10*3/uL (ref 0.1–1.0)
Monocytes Relative: 10.8 % (ref 3.0–12.0)
Neutrophils Relative %: 41.7 % — ABNORMAL LOW (ref 43.0–77.0)
RBC: 4.18 Mil/uL (ref 3.87–5.11)
WBC: 3.6 10*3/uL — ABNORMAL LOW (ref 4.5–10.5)

## 2012-11-15 LAB — LIPID PANEL
Cholesterol: 261 mg/dL — ABNORMAL HIGH (ref 0–200)
HDL: 48.3 mg/dL (ref >39.00)
Total CHOL/HDL Ratio: 5
Triglycerides: 114 mg/dL (ref 0.0–149.0)

## 2012-11-21 ENCOUNTER — Encounter: Payer: Self-pay | Admitting: Family Medicine

## 2012-11-21 ENCOUNTER — Ambulatory Visit (INDEPENDENT_AMBULATORY_CARE_PROVIDER_SITE_OTHER): Payer: Medicare Other | Admitting: Family Medicine

## 2012-11-21 VITALS — BP 90/60 | HR 69 | Temp 97.8°F | Ht 59.75 in | Wt 134.5 lb

## 2012-11-21 DIAGNOSIS — Z0000 Encounter for general adult medical examination without abnormal findings: Secondary | ICD-10-CM

## 2012-11-21 DIAGNOSIS — E785 Hyperlipidemia, unspecified: Secondary | ICD-10-CM

## 2012-11-21 DIAGNOSIS — M899 Disorder of bone, unspecified: Secondary | ICD-10-CM

## 2012-11-21 DIAGNOSIS — M949 Disorder of cartilage, unspecified: Secondary | ICD-10-CM

## 2012-11-21 NOTE — Progress Notes (Signed)
Subjective:    Patient ID: Melanie Marshall, female    DOB: 20-Aug-1945, 67 y.o.   MRN: [id number]  HPI I have personally reviewed the Medicare Annual Wellness questionnaire and have noted 1. The patient's medical and social history 2. Their use of alcohol, tobacco or illicit drugs 3. Their current medications and supplements 4. The patient's functional ability including ADL's, fall risks, home safety risks and hearing or visual             impairment. 5. Diet and physical activities 6. Evidence for depression or mood disorders  The patients weight, height, BMI have been recorded in the chart and visual acuity is per eye clinic.  I have made referrals, counseling and provided education to the patient based review of the above and I have provided the pt with a written personalized care plan for preventive services.  Has been feeling pretty well   See scanned forms.  Routine anticipatory guidance given to patient.  See health maintenance. Flu-[facility] Shingles vaccine 11/13 PNA 2/13 -complete Tetanus 10/08 Colon 3/13 - recall 10 years  Breast cancer screening mammogram 12/13- did have a recall which was fine  Self exam - no lumps or changes  Advance directive -has a POA and living will  Cognitive function addressed- see scanned forms- and if abnormal then additional documentation follows. - no problems per pt   PMH and SH reviewed  Meds, vitals, and allergies reviewed.   Wt is up 2 lb with bmi of 26  Falls- none  No fractures  Osteopenia dexa 5/12- thinks that is correct She does not want a dexa again yet  On fosamax - at least 4-5 years - is done with that now  She exercises and takes ca and D D level is 32   Mood- good / very motivated   Hyperlipidemia Lab Results  Component Value Date   CHOL 261* 11/15/2012   CHOL 212* 09/21/2011   CHOL 212* 07/20/2010   Lab Results  Component Value Date   HDL 48.30 11/15/2012   HDL 54.00 09/21/2011   HDL 43.40  07/20/2010   No results found for this basename: LDLCALC   Lab Results  Component Value Date   TRIG 114.0 11/15/2012   TRIG 111.0 09/21/2011   TRIG 149.0 07/20/2010   Lab Results  Component Value Date   CHOLHDL 5 11/15/2012   CHOLHDL 4 09/21/2011   CHOLHDL 5 07/20/2010   Lab Results  Component Value Date   LDLDIRECT 193.6 11/15/2012   LDLDIRECT 143.8 09/21/2011   LDLDIRECT 135.8 07/20/2010   this went up significantly  Is eating low fat most of the time  Eats some cheese and shellfish  Almost no fried food or red meat Does exercise   Patient Active Problem List  Diagnosis  . HYPERLIPIDEMIA  . ALLERGIC RHINITIS  . OSTEOPENIA  . MURMUR  . MIGRAINES, HX OF  . NEUTROPENIA UNSPECIFIED  . Special screening for malignant neoplasms, colon  . Encounter for Medicare annual wellness exam   Past Medical History  Diagnosis Date  . Allergy     allergic rhinitis  . Osteopenia   . Hyperlipidemia   . Arthritis   . History of blood transfusion 1970    after childbirth  . Heart murmur     not treated   Past Surgical History  Procedure Laterality Date  . Tubal ligation  1979   History  Substance Use Topics  . Smoking status: Former Smoker  Quit date: 08/16/1967  . Smokeless tobacco: Never Used  . Alcohol Use: 0.6 oz/week    1 Glasses of wine per week   Family History  Problem Relation Age of Onset  . Diabetes Mother   . Heart disease Mother     MI  . Hypertension Father     ??HTN  . Hyperlipidemia Father   . Cancer Father     pancreatic cancer  . Pancreatic cancer Father   . Hypertension Brother   . Cancer Paternal Grandmother     breast CA  . Colon cancer Neg Hx   . Stomach cancer Neg Hx    No Known Allergies Current Outpatient Prescriptions on File Prior to Visit  Medication Sig Dispense Refill  . Calcium Carbonate (CALCIUM 600 PO) Take 1 tablet by mouth daily.      . Calcium Carbonate-Vit D-Min 600-400 MG-UNIT TABS Take 1 tablet by mouth daily.      [name] Kitchen ibuprofen  (ADVIL,MOTRIN) 200 MG tablet Take 200 mg by mouth every 6 (six) hours as needed.      . Multiple Vitamin (MULTIVITAMIN) tablet Take 1 tablet by mouth daily.       No current facility-administered medications on file prior to visit.    Review of Systems Review of Systems  Constitutional: Negative for fever, appetite change, fatigue and unexpected weight change.  Eyes: Negative for pain and visual disturbance.  Respiratory: Negative for cough and shortness of breath.   Cardiovascular: Negative for cp or palpitations    Gastrointestinal: Negative for nausea, diarrhea and constipation.  Genitourinary: Negative for urgency and frequency.  Skin: Negative for pallor or rash   Neurological: Negative for weakness, light-headedness, numbness and headaches.  Hematological: Negative for adenopathy. Does not bruise/bleed easily.  Psychiatric/Behavioral: Negative for dysphoric mood. The patient is not nervous/anxious.         Objective:   Physical Exam  Constitutional: She appears well-developed and well-nourished. No distress.  HENT:  Head: Normocephalic and atraumatic.  Right Ear: External ear normal.  Left Ear: External ear normal.  Nose: Nose normal.  Mouth/Throat: Oropharynx is clear and moist. No oropharyngeal exudate.  Eyes: Conjunctivae and EOM are normal. Pupils are equal, round, and reactive to light. Right eye exhibits no discharge. Left eye exhibits no discharge. No scleral icterus.  Neck: Normal range of motion. Neck supple. No JVD present. Carotid bruit is not present. No thyromegaly present.  Cardiovascular: Normal rate, regular rhythm and intact distal pulses.   Murmur heard. Pulmonary/Chest: Effort normal and breath sounds normal. No respiratory distress. She has no wheezes. She has no rales.  Abdominal: Soft. Bowel sounds are normal. She exhibits no distension, no abdominal bruit and no mass. There is no tenderness.  Genitourinary: No breast swelling, tenderness, discharge or  bleeding.  Breast exam: No mass, nodules, thickening, tenderness, bulging, retraction, inflamation, nipple discharge or skin changes noted.  No axillary or clavicular LA.  Chaperoned exam.    Musculoskeletal: Normal range of motion. She exhibits no edema and no tenderness.  Lymphadenopathy:    She has no cervical adenopathy.  Neurological: She is alert. She has normal reflexes. No cranial nerve deficit. She exhibits normal muscle tone. Coordination normal.  Skin: Skin is warm and dry. No rash noted. No erythema. No pallor.  Psychiatric: She has a normal mood and affect.          Assessment & Plan:

## 2012-11-21 NOTE — Patient Instructions (Addendum)
Increase vitamin D to 2000 iu daily  Avoid red meat/ fried foods/ egg yolks/ fatty breakfast meats/ butter, cheese and high fat dairy/ and shellfish  Schedule fasting lab in 3 months for lipid profile and then follow up to discuss results Take care of yourself

## 2012-11-22 NOTE — Assessment & Plan Note (Signed)
Reviewed health habits including diet and exercise and skin cancer prevention Also reviewed health mt list, fam hx and immunizations  See HPI Labs rev

## 2012-11-22 NOTE — Assessment & Plan Note (Signed)
dexa 5/12 No falls or fractures Will inc D to 2000 iu daily Pt declines further dexa at this time  Disc safety and imp of exercise

## 2012-11-22 NOTE — Assessment & Plan Note (Signed)
Lipids are up significantly  Disc goals for lipids and reasons to control them Rev labs with pt Rev low sat fat diet in detail  Re check 3 mo and f/u - consider statin if not at goal

## 2013-02-19 ENCOUNTER — Telehealth: Payer: Self-pay | Admitting: Family Medicine

## 2013-02-19 DIAGNOSIS — E785 Hyperlipidemia, unspecified: Secondary | ICD-10-CM

## 2013-02-19 NOTE — Telephone Encounter (Signed)
Message copied by Judy Pimple on Tue Feb 19, 2013  7:57 AM ------      Message from: Alvina Chou      Created: Wed Feb 13, 2013  8:44 AM      Regarding: Lab orders for Thursday, 7.10.14       Labs for a 3 month f/u ------

## 2013-02-21 ENCOUNTER — Other Ambulatory Visit (INDEPENDENT_AMBULATORY_CARE_PROVIDER_SITE_OTHER): Payer: Medicare Other

## 2013-02-21 DIAGNOSIS — E785 Hyperlipidemia, unspecified: Secondary | ICD-10-CM

## 2013-02-21 LAB — LIPID PANEL
Cholesterol: 204 mg/dL — ABNORMAL HIGH (ref 0–200)
Total CHOL/HDL Ratio: 4
Triglycerides: 151 mg/dL — ABNORMAL HIGH (ref 0.0–149.0)
VLDL: 30.2 mg/dL (ref 0.0–40.0)

## 2013-03-01 ENCOUNTER — Encounter: Payer: Self-pay | Admitting: Family Medicine

## 2013-03-01 ENCOUNTER — Ambulatory Visit (INDEPENDENT_AMBULATORY_CARE_PROVIDER_SITE_OTHER): Payer: Medicare Other | Admitting: Family Medicine

## 2013-03-01 VITALS — BP 118/68 | HR 66 | Temp 98.5°F | Ht 59.75 in

## 2013-03-01 DIAGNOSIS — E785 Hyperlipidemia, unspecified: Secondary | ICD-10-CM

## 2013-03-01 DIAGNOSIS — L820 Inflamed seborrheic keratosis: Secondary | ICD-10-CM | POA: Insufficient documentation

## 2013-03-01 NOTE — Assessment & Plan Note (Signed)
Irritated SK on back treated with liquid nitrogen today  Disc aftercare  Update if problems

## 2013-03-01 NOTE — Assessment & Plan Note (Signed)
Much improved with diet - LDL almost at goal of 130 or below  Enc further good work Disc goals for lipids and reasons to control them Rev labs with pt Rev low sat fat diet in detail  Will continue to follow

## 2013-03-01 NOTE — Patient Instructions (Addendum)
We treated a keratosis on your back today- keep it clean and you can use an otc antibiotic ointment  Cholesterol is much improved and almost at goal Keep watching diet  Avoid red meat/ fried foods/ egg yolks/ fatty breakfast meats/ butter, cheese and high fat dairy/ and shellfish

## 2013-03-01 NOTE — Progress Notes (Signed)
Subjective:    Patient ID: Melanie Marshall, female    DOB: June 20, 1946, 67 y.o.   MRN: [id number]  HPI Here for f/u of hyperlipidemia   Last visit very high chol  Disc diet/ lifestyle Lab Results  Component Value Date   CHOL 204* 02/21/2013   CHOL 261* 11/15/2012   CHOL 212* 09/21/2011   Lab Results  Component Value Date   HDL 49.10 02/21/2013   HDL 40.98 11/15/2012   HDL 54.00 09/21/2011   No results found for this basename: LDLCALC   Lab Results  Component Value Date   TRIG 151.0* 02/21/2013   TRIG 114.0 11/15/2012   TRIG 111.0 09/21/2011   Lab Results  Component Value Date   CHOLHDL 4 02/21/2013   CHOLHDL 5 11/15/2012   CHOLHDL 4 09/21/2011   Lab Results  Component Value Date   LDLDIRECT 138.8 02/21/2013   LDLDIRECT 193.6 11/15/2012   LDLDIRECT 143.8 09/21/2011    Per pt - she was very good for 2 months- ate nothing fatty at all  No red meat or fried food and no cheese and no eggs or shellfish  Then went to a wedding/ beach and another trip and did not exercise  Goal is LDL under 130  Father had high chol / and mother had heart dz -- older age   Patient Active Problem List   Diagnosis Date Noted  . Encounter for Medicare annual wellness exam 11/14/2012  . Special screening for malignant neoplasms, colon 09/23/2011  . NEUTROPENIA UNSPECIFIED 07/23/2010  . HYPERLIPIDEMIA 06/24/2008  . ALLERGIC RHINITIS 04/17/2007  . OSTEOPENIA 04/17/2007  . MURMUR 04/17/2007  . MIGRAINES, HX OF 04/17/2007   Past Medical History  Diagnosis Date  . Allergy     allergic rhinitis  . Osteopenia   . Hyperlipidemia   . Arthritis   . History of blood transfusion 1970    after childbirth  . Heart murmur     not treated   Past Surgical History  Procedure Laterality Date  . Tubal ligation  1979   History  Substance Use Topics  . Smoking status: Former Smoker    Quit date: 08/16/1967  . Smokeless tobacco: Never Used  . Alcohol Use: 0.6 oz/week    1 Glasses of wine per week     Comment: occ    Family History  Problem Relation Age of Onset  . Diabetes Mother   . Heart disease Mother     MI  . Hypertension Father     ??HTN  . Hyperlipidemia Father   . Cancer Father     pancreatic cancer  . Pancreatic cancer Father   . Hypertension Brother   . Cancer Paternal Grandmother     breast CA  . Colon cancer Neg Hx   . Stomach cancer Neg Hx    No Known Allergies Current Outpatient Prescriptions on File Prior to Visit  Medication Sig Dispense Refill  . Calcium Carbonate (CALCIUM 600 PO) Take 1 tablet by mouth daily.      . Calcium Carbonate-Vit D-Min 600-400 MG-UNIT TABS Take 1 tablet by mouth daily.      [name] Kitchen ibuprofen (ADVIL,MOTRIN) 200 MG tablet Take 200 mg by mouth every 6 (six) hours as needed.      . Multiple Vitamin (MULTIVITAMIN) tablet Take 1 tablet by mouth daily.       No current facility-administered medications on file prior to visit.     Also SK on R side of back - is really bothering  her and she wants to treat it  Rubs on bra and clothing    Patient Active Problem List   Diagnosis Date Noted  . Encounter for Medicare annual wellness exam 11/14/2012  . Special screening for malignant neoplasms, colon 09/23/2011  . NEUTROPENIA UNSPECIFIED 07/23/2010  . HYPERLIPIDEMIA 06/24/2008  . ALLERGIC RHINITIS 04/17/2007  . OSTEOPENIA 04/17/2007  . MURMUR 04/17/2007  . MIGRAINES, HX OF 04/17/2007   Past Medical History  Diagnosis Date  . Allergy     allergic rhinitis  . Osteopenia   . Hyperlipidemia   . Arthritis   . History of blood transfusion 1970    after childbirth  . Heart murmur     not treated   Past Surgical History  Procedure Laterality Date  . Tubal ligation  1979   History  Substance Use Topics  . Smoking status: Former Smoker    Quit date: 08/16/1967  . Smokeless tobacco: Never Used  . Alcohol Use: 0.6 oz/week    1 Glasses of wine per week     Comment: occ   Family History  Problem Relation Age of Onset  . Diabetes Mother   . Heart  disease Mother     MI  . Hypertension Father     ??HTN  . Hyperlipidemia Father   . Cancer Father     pancreatic cancer  . Pancreatic cancer Father   . Hypertension Brother   . Cancer Paternal Grandmother     breast CA  . Colon cancer Neg Hx   . Stomach cancer Neg Hx    No Known Allergies Current Outpatient Prescriptions on File Prior to Visit  Medication Sig Dispense Refill  . Calcium Carbonate (CALCIUM 600 PO) Take 1 tablet by mouth daily.      . Calcium Carbonate-Vit D-Min 600-400 MG-UNIT TABS Take 1 tablet by mouth daily.      [name] Kitchen ibuprofen (ADVIL,MOTRIN) 200 MG tablet Take 200 mg by mouth every 6 (six) hours as needed.      . Multiple Vitamin (MULTIVITAMIN) tablet Take 1 tablet by mouth daily.       No current facility-administered medications on file prior to visit.    Review of Systems Review of Systems  Constitutional: Negative for fever, appetite change, fatigue and unexpected weight change.  Eyes: Negative for pain and visual disturbance.  Respiratory: Negative for cough and shortness of breath.   Cardiovascular: Negative for cp or palpitations    Gastrointestinal: Negative for nausea, diarrhea and constipation.  Genitourinary: Negative for urgency and frequency.  Skin: Negative for pallor or rash  pos for lesion on back  Neurological: Negative for weakness, light-headedness, numbness and headaches.  Hematological: Negative for adenopathy. Does not bruise/bleed easily.  Psychiatric/Behavioral: Negative for dysphoric mood. The patient is not nervous/anxious.         Objective:   Physical Exam  Constitutional: She appears well-developed and well-nourished. No distress.  HENT:  Head: Normocephalic and atraumatic.  Mouth/Throat: Oropharynx is clear and moist.  Eyes: Conjunctivae and EOM are normal. Pupils are equal, round, and reactive to light.  Neck: Normal range of motion. Neck supple. No JVD present. Carotid bruit is not present.  Cardiovascular: Normal rate,  regular rhythm, normal heart sounds and intact distal pulses.  Exam reveals no gallop.   Pulmonary/Chest: Effort normal and breath sounds normal.  Musculoskeletal: She exhibits no edema.  Neurological: She is alert. She has normal reflexes. No cranial nerve deficit. She exhibits normal muscle tone. Coordination normal.  Skin: Skin  is warm and dry. No rash noted. No erythema.  R side of mid back - 4 mm oval SK tan and raised and inflammed This was treated with cryotx - full freeze and thaw times one   Psychiatric: She has a normal mood and affect.          Assessment & Plan:

## 2013-06-20 ENCOUNTER — Other Ambulatory Visit: Payer: Self-pay

## 2013-07-22 ENCOUNTER — Other Ambulatory Visit: Payer: Self-pay

## 2013-07-22 DIAGNOSIS — Z1231 Encounter for screening mammogram for malignant neoplasm of breast: Secondary | ICD-10-CM

## 2013-08-22 ENCOUNTER — Ambulatory Visit
Admission: RE | Admit: 2013-08-22 | Discharge: 2013-08-22 | Disposition: A | Discharge status: Home / Self Care | Payer: Medicare Other | Source: Ambulatory Visit

## 2013-08-22 DIAGNOSIS — Z1231 Encounter for screening mammogram for malignant neoplasm of breast: Secondary | ICD-10-CM

## 2013-09-20 ENCOUNTER — Telehealth: Payer: Self-pay

## 2013-09-20 NOTE — Telephone Encounter (Signed)
Pt left v/m; pt is traveling to [place] the end of Feb on mission trip and malaria pills have been recommended. Pt wants to know if Dr Glori Bickers thinks pt should take malaria pills prior to trip and if so would like written rx for malaria pills. Pt request cb.

## 2013-09-22 MED ORDER — ATOVAQUONE-PROGUANIL HCL 250-100 MG PO TABS: ORAL_TABLET | ORAL | Status: DC

## 2013-09-22 NOTE — Telephone Encounter (Signed)
I think that is a good idea  Px written for call in  For malarone  Also she may want to consider a visit [facility] the [facility] to rev imms (they may want her to get a typhoid imm) You may have to alter the # of pills in px based on length of stay

## 2013-09-23 MED ORDER — ATOVAQUONE-PROGUANIL HCL 250-100 MG PO TABS: ORAL_TABLET | ORAL | Status: DC

## 2013-09-23 NOTE — Telephone Encounter (Signed)
Pt returning call and request cb [phone].

## 2013-09-23 NOTE — Telephone Encounter (Signed)
Pt came by office; pt notified as instructed; pt will be gone for 12 days and #21 of Malarone was called in to [facility] as instructed. Pt will also contact health dept.

## 2013-09-23 NOTE — Telephone Encounter (Signed)
Left voicemail on both # requesting pt to call office

## 2013-11-22 ENCOUNTER — Encounter: Payer: Medicare Other | Admitting: Family Medicine

## 2013-12-01 ENCOUNTER — Telehealth: Payer: Self-pay | Admitting: Family Medicine

## 2013-12-01 DIAGNOSIS — M949 Disorder of cartilage, unspecified: Principal | ICD-10-CM

## 2013-12-01 DIAGNOSIS — M899 Disorder of bone, unspecified: Secondary | ICD-10-CM

## 2013-12-01 DIAGNOSIS — E785 Hyperlipidemia, unspecified: Secondary | ICD-10-CM

## 2013-12-01 DIAGNOSIS — Z0000 Encounter for general adult medical examination without abnormal findings: Secondary | ICD-10-CM

## 2013-12-01 NOTE — Telephone Encounter (Signed)
Message copied by Abner Greenspan on Sun Dec 01, 2013  1:39 PM ------      Message from: Ellamae Sia      Created: Wed Nov 27, 2013  3:37 PM      Regarding: Lab orders for Monday, 4.20.15       Patient is scheduled for CPX labs, please order future labs, Thanks , Terri       ------

## 2013-12-02 ENCOUNTER — Other Ambulatory Visit (INDEPENDENT_AMBULATORY_CARE_PROVIDER_SITE_OTHER): Payer: Medicare Other

## 2013-12-02 DIAGNOSIS — M949 Disorder of cartilage, unspecified: Secondary | ICD-10-CM

## 2013-12-02 DIAGNOSIS — E785 Hyperlipidemia, unspecified: Secondary | ICD-10-CM

## 2013-12-02 DIAGNOSIS — Z0000 Encounter for general adult medical examination without abnormal findings: Secondary | ICD-10-CM

## 2013-12-02 DIAGNOSIS — M899 Disorder of bone, unspecified: Secondary | ICD-10-CM

## 2013-12-02 LAB — CBC WITH DIFFERENTIAL/PLATELET
BASOS ABS: 0 10*3/uL (ref 0.0–0.1)
Basophils Relative: 0.6 % (ref 0.0–3.0)
Eosinophils Absolute: 0 10*3/uL (ref 0.0–0.7)
Eosinophils Relative: 1.3 % (ref 0.0–5.0)
HCT: 38.3 % (ref 36.0–46.0)
Hemoglobin: 12.8 g/dL (ref 12.0–15.0)
LYMPHS ABS: 1.7 10*3/uL (ref 0.7–4.0)
LYMPHS PCT: 50.2 % — AB (ref 12.0–46.0)
MCHC: 33.4 g/dL (ref 30.0–36.0)
MCV: 90.2 fl (ref 78.0–100.0)
MONOS PCT: 8.9 % (ref 3.0–12.0)
Monocytes Absolute: 0.3 10*3/uL (ref 0.1–1.0)
Neutro Abs: 1.4 10*3/uL (ref 1.4–7.7)
Neutrophils Relative %: 39 % — ABNORMAL LOW (ref 43.0–77.0)
PLATELETS: 324 10*3/uL (ref 150.0–400.0)
RBC: 4.25 Mil/uL (ref 3.87–5.11)
RDW: 13.7 % (ref 11.5–14.6)
WBC: 3.5 10*3/uL — ABNORMAL LOW (ref 4.5–10.5)

## 2013-12-02 LAB — COMPREHENSIVE METABOLIC PANEL
ALT: 14 U/L (ref 0–35)
AST: 19 U/L (ref 0–37)
Albumin: 4.2 g/dL (ref 3.5–5.2)
Alkaline Phosphatase: 38 U/L — ABNORMAL LOW (ref 39–117)
BILIRUBIN TOTAL: 0.8 mg/dL (ref 0.3–1.2)
BUN: 20 mg/dL (ref 6–23)
CHLORIDE: 104 meq/L (ref 96–112)
CO2: 27 meq/L (ref 19–32)
Calcium: 9.4 mg/dL (ref 8.4–10.5)
Creatinine, Ser: 0.8 mg/dL (ref 0.4–1.2)
GFR: 75.96 mL/min (ref >60.00)
Glucose, Bld: 96 mg/dL (ref 70–99)
POTASSIUM: 4.6 meq/L (ref 3.5–5.1)
SODIUM: 139 meq/L (ref 135–145)
Total Protein: 7.6 g/dL (ref 6.0–8.3)

## 2013-12-02 LAB — LIPID PANEL
Cholesterol: 219 mg/dL — ABNORMAL HIGH (ref 0–200)
HDL: 44.4 mg/dL (ref >39.00)
LDL Cholesterol: 146 mg/dL — ABNORMAL HIGH (ref 0–99)
Total CHOL/HDL Ratio: 5
Triglycerides: 142 mg/dL (ref 0.0–149.0)
VLDL: 28.4 mg/dL (ref 0.0–40.0)

## 2013-12-02 LAB — TSH: TSH: 1.22 u[IU]/mL (ref 0.35–5.50)

## 2013-12-03 LAB — VITAMIN D 25 HYDROXY (VIT D DEFICIENCY, FRACTURES): VIT D 25 HYDROXY: 32 ng/mL (ref 30–89)

## 2013-12-06 ENCOUNTER — Ambulatory Visit (INDEPENDENT_AMBULATORY_CARE_PROVIDER_SITE_OTHER): Payer: Medicare Other | Admitting: Family Medicine

## 2013-12-06 ENCOUNTER — Encounter: Payer: Self-pay | Admitting: Family Medicine

## 2013-12-06 VITALS — BP 144/74 | HR 60 | Temp 97.8°F | Ht 59.5 in | Wt 131.8 lb

## 2013-12-06 DIAGNOSIS — Z0000 Encounter for general adult medical examination without abnormal findings: Secondary | ICD-10-CM

## 2013-12-06 DIAGNOSIS — M899 Disorder of bone, unspecified: Secondary | ICD-10-CM

## 2013-12-06 DIAGNOSIS — M949 Disorder of cartilage, unspecified: Secondary | ICD-10-CM

## 2013-12-06 DIAGNOSIS — Z23 Encounter for immunization: Secondary | ICD-10-CM

## 2013-12-06 DIAGNOSIS — E785 Hyperlipidemia, unspecified: Secondary | ICD-10-CM

## 2013-12-06 NOTE — Progress Notes (Signed)
Subjective:    Patient ID: Melanie Marshall, female    DOB: November 01, 1945, 68 y.o.   MRN: [id number]  HPI I have personally reviewed the Medicare Annual Wellness questionnaire and have noted 1. The patient's medical and social history 2. Their use of alcohol, tobacco or illicit drugs 3. Their current medications and supplements 4. The patient's functional ability including ADL's, fall risks, home safety risks and hearing or visual             impairment. 5. Diet and physical activities 6. Evidence for depression or mood disorders  The patients weight, height, BMI have been recorded in the chart and visual acuity is per eye clinic.  I have made referrals, counseling and provided education to the patient based review of the above and I have provided the pt with a written personalized care plan for preventive services.  Nothing new going on medically Feels good   See scanned forms.  Routine anticipatory guidance given to patient.  See health maintenance. Colon cancer screening 3/13 colonoscopy Breast cancer screening mammogram 1/15  Self breast exam-no lumps or changes  Flu vaccine- had in the fall  Tetanus vaccine 10/08  Pneumovax 2/13 - will do prevnar today  Zoster vaccine 11/12  No gyn problems    Advance directive-she has a living will and we have it on file  (sister is her POA) Cognitive function addressed- see scanned forms- and if abnormal then additional documentation follows. No worries about her memory   PMH and SH reviewed  Meds, vitals, and allergies reviewed.   ROS: See HPI.  Otherwise negative.     Osteopenia  dexa 5/12- does not want to do another one yet  T score FN -1.8 D level is 32- in the low normal range - she takes ca and D but had quit her mvi due to nausea so she will inc her D  Fracture hx -none Mother had OP   Hyperlipidemia Lab Results  Component Value Date   CHOL 219* 12/02/2013   CHOL 204* 02/21/2013   CHOL 261* 11/15/2012   Lab Results    Component Value Date   HDL 44.40 12/02/2013   HDL 49.10 02/21/2013   HDL 48.30 11/15/2012   Lab Results  Component Value Date   LDLCALC 146* 12/02/2013   Lab Results  Component Value Date   TRIG 142.0 12/02/2013   TRIG 151.0* 02/21/2013   TRIG 114.0 11/15/2012   Lab Results  Component Value Date   CHOLHDL 5 12/02/2013   CHOLHDL 4 02/21/2013   CHOLHDL 5 11/15/2012   Lab Results  Component Value Date   LDLDIRECT 138.8 02/21/2013   LDLDIRECT 193.6 11/15/2012   LDLDIRECT 143.8 09/21/2011    Cholesterol is up a bit  She did really well with her diet for a long time -then traveled to [place] and [place] - and ate a lot of shellfish - thinks she can bring it down   Results for orders placed in visit on 12/02/13  CBC WITH DIFFERENTIAL      Result Value Ref Range   WBC 3.5 (*) 4.5 - 10.5 K/uL   RBC 4.25  3.87 - 5.11 Mil/uL   Hemoglobin 12.8  12.0 - 15.0 g/dL   HCT 38.3  36.0 - 46.0 %   MCV 90.2  78.0 - 100.0 fl   MCHC 33.4  30.0 - 36.0 g/dL   RDW 13.7  11.5 - 14.6 %   Platelets 324.0  150.0 - 400.0 K/uL  Neutrophils Relative % 39.0 (*) 43.0 - 77.0 %   Lymphocytes Relative 50.2 (*) 12.0 - 46.0 %   Monocytes Relative 8.9  3.0 - 12.0 %   Eosinophils Relative 1.3  0.0 - 5.0 %   Basophils Relative 0.6  0.0 - 3.0 %   Neutro Abs 1.4  1.4 - 7.7 K/uL   Lymphs Abs 1.7  0.7 - 4.0 K/uL   Monocytes Absolute 0.3  0.1 - 1.0 K/uL   Eosinophils Absolute 0.0  0.0 - 0.7 K/uL   Basophils Absolute 0.0  0.0 - 0.1 K/uL  COMPREHENSIVE METABOLIC PANEL      Result Value Ref Range   Sodium 139  135 - 145 mEq/L   Potassium 4.6  3.5 - 5.1 mEq/L   Chloride 104  96 - 112 mEq/L   CO2 27  19 - 32 mEq/L   Glucose, Bld 96  70 - 99 mg/dL   BUN 20  6 - 23 mg/dL   Creatinine, Ser 0.8  0.4 - 1.2 mg/dL   Total Bilirubin 0.8  0.3 - 1.2 mg/dL   Alkaline Phosphatase 38 (*) 39 - 117 U/L   AST 19  0 - 37 U/L   ALT 14  0 - 35 U/L   Total Protein 7.6  6.0 - 8.3 g/dL   Albumin 4.2  3.5 - 5.2 g/dL   Calcium 9.4  8.4 - 10.5  mg/dL   GFR 75.96  >60.00 mL/min  LIPID PANEL      Result Value Ref Range   Cholesterol 219 (*) 0 - 200 mg/dL   Triglycerides 142.0  0.0 - 149.0 mg/dL   HDL 44.40  >39.00 mg/dL   VLDL 28.4  0.0 - 40.0 mg/dL   LDL Cholesterol 146 (*) 0 - 99 mg/dL   Total CHOL/HDL Ratio 5    TSH      Result Value Ref Range   TSH 1.22  0.35 - 5.50 uIU/mL  VITAMIN D 25 HYDROXY      Result Value Ref Range   Vit D, 25-Hydroxy 32  30 - 89 ng/mL    Patient Active Problem List   Diagnosis Date Noted  . Seborrheic keratosis, inflamed 03/01/2013  . Encounter for Medicare annual wellness exam 11/14/2012  . Special screening for malignant neoplasms, colon 09/23/2011  . NEUTROPENIA UNSPECIFIED 07/23/2010  . HYPERLIPIDEMIA 06/24/2008  . ALLERGIC RHINITIS 04/17/2007  . OSTEOPENIA 04/17/2007  . MURMUR 04/17/2007  . MIGRAINES, HX OF 04/17/2007   Past Medical History  Diagnosis Date  . Allergy     allergic rhinitis  . Osteopenia   . Hyperlipidemia   . Arthritis   . History of blood transfusion 1970    after childbirth  . Heart murmur     not treated   Past Surgical History  Procedure Laterality Date  . Tubal ligation  1979   History  Substance Use Topics  . Smoking status: Former Smoker    Quit date: 08/16/1967  . Smokeless tobacco: Never Used  . Alcohol Use: 0.6 oz/week    1 Glasses of wine per week     Comment: occ   Family History  Problem Relation Age of Onset  . Diabetes Mother   . Heart disease Mother     MI  . Hypertension Father     ??HTN  . Hyperlipidemia Father   . Cancer Father     pancreatic cancer  . Pancreatic cancer Father   . Hypertension Brother   . Cancer Paternal  Grandmother     breast CA  . Colon cancer Neg Hx   . Stomach cancer Neg Hx    No Known Allergies Current Outpatient Prescriptions on File Prior to Visit  Medication Sig Dispense Refill  . Calcium Carbonate-Vit D-Min 600-400 MG-UNIT TABS Take 1 tablet by mouth daily.      [name] Kitchen ibuprofen (ADVIL,MOTRIN)  200 MG tablet Take 200 mg by mouth every 6 (six) hours as needed.       No current facility-administered medications on file prior to visit.    Review of Systems Review of Systems  Constitutional: Negative for fever, appetite change, fatigue and unexpected weight change.  Eyes: Negative for pain and visual disturbance.  Respiratory: Negative for cough and shortness of breath.   Cardiovascular: Negative for cp or palpitations    Gastrointestinal: Negative for nausea, diarrhea and constipation.  Genitourinary: Negative for urgency and frequency.  Skin: Negative for pallor or rash   Neurological: Negative for weakness, light-headedness, numbness and headaches.  Hematological: Negative for adenopathy. Does not bruise/bleed easily.  Psychiatric/Behavioral: Negative for dysphoric mood. The patient is not nervous/anxious.         Objective:   Physical Exam  Constitutional: She appears well-developed and well-nourished. No distress.  HENT:  Head: Normocephalic and atraumatic.  Right Ear: External ear normal.  Left Ear: External ear normal.  Mouth/Throat: Oropharynx is clear and moist.  Eyes: Conjunctivae and EOM are normal. Pupils are equal, round, and reactive to light. No scleral icterus.  Neck: Normal range of motion. Neck supple. No JVD present. Carotid bruit is not present. No thyromegaly present.  Cardiovascular: Normal rate, regular rhythm, normal heart sounds and intact distal pulses.  Exam reveals no gallop.   Pulmonary/Chest: Effort normal and breath sounds normal. No respiratory distress. She has no wheezes. She exhibits no tenderness.  Abdominal: Soft. Bowel sounds are normal. She exhibits no distension, no abdominal bruit and no mass. There is no tenderness.  Genitourinary: No breast swelling, tenderness, discharge or bleeding.  Breast exam: No mass, nodules, thickening, tenderness, bulging, retraction, inflamation, nipple discharge or skin changes noted.  No axillary or  clavicular LA.      Musculoskeletal: Normal range of motion. She exhibits no edema and no tenderness.  No kyphosis   Lymphadenopathy:    She has no cervical adenopathy.  Neurological: She is alert. She has normal reflexes. No cranial nerve deficit. She exhibits normal muscle tone. Coordination normal.  Skin: Skin is warm and dry. No rash noted. No erythema. No pallor.  Psychiatric: She has a normal mood and affect.          Assessment & Plan:

## 2013-12-06 NOTE — Patient Instructions (Addendum)
prevnar vaccine today  Watch diet for cholesterol (Avoid red meat/ fried foods/ egg yolks/ fatty breakfast meats/ butter, cheese and high fat dairy/ and shellfish ) Take care of yourself     Fat and Cholesterol Control Diet Fat and cholesterol levels in your blood and organs are influenced by your diet. High levels of fat and cholesterol may lead to diseases of the heart, small and large blood vessels, gallbladder, liver, and pancreas. CONTROLLING FAT AND CHOLESTEROL WITH DIET Although exercise and lifestyle factors are important, your diet is key. That is because certain foods are known to raise cholesterol and others to lower it. The goal is to balance foods for their effect on cholesterol and more importantly, to replace saturated and trans fat with other types of fat, such as monounsaturated fat, polyunsaturated fat, and omega-3 fatty acids. On average, a person should consume no more than 15 to 17 g of saturated fat daily. Saturated and trans fats are considered "bad" fats, and they will raise LDL cholesterol. Saturated fats are primarily found in animal products such as meats, butter, and cream. However, that does not mean you need to give up all your favorite foods. Today, there are good tasting, low-fat, low-cholesterol substitutes for most of the things you like to eat. Choose low-fat or nonfat alternatives. Choose round or loin cuts of red meat. These types of cuts are lowest in fat and cholesterol. Chicken (without the skin), fish, veal, and ground [place] breast are great choices. Eliminate fatty meats, such as hot dogs and salami. Even shellfish have little or no saturated fat. Have a 3 oz (85 g) portion when you eat lean meat, poultry, or fish. Trans fats are also called "partially hydrogenated oils." They are oils that have been scientifically manipulated so that they are solid at room temperature resulting in a longer shelf life and improved taste and texture of foods in which they are  added. Trans fats are found in stick margarine, some tub margarines, cookies, crackers, and baked goods.  When baking and cooking, oils are a great substitute for butter. The monounsaturated oils are especially beneficial since it is believed they lower LDL and raise HDL. The oils you should avoid entirely are saturated tropical oils, such as coconut and palm.  Remember to eat a lot from food groups that are naturally free of saturated and trans fat, including fish, fruit, vegetables, beans, grains (barley, rice, couscous, bulgur wheat), and pasta (without cream sauces).  IDENTIFYING FOODS THAT LOWER FAT AND CHOLESTEROL  Soluble fiber may lower your cholesterol. This type of fiber is found in fruits such as apples, vegetables such as broccoli, potatoes, and carrots, legumes such as beans, peas, and lentils, and grains such as barley. Foods fortified with plant sterols (phytosterol) may also lower cholesterol. You should eat at least 2 g per day of these foods for a cholesterol lowering effect.  Read package labels to identify low-saturated fats, trans fat free, and low-fat foods at the supermarket. Select cheeses that have only 2 to 3 g saturated fat per ounce. Use a heart-healthy tub margarine that is free of trans fats or partially hydrogenated oil. When buying baked goods (cookies, crackers), avoid partially hydrogenated oils. Breads and muffins should be made from whole grains (whole-wheat or whole oat flour, instead of "flour" or "enriched flour"). Buy non-creamy canned soups with reduced salt and no added fats.  FOOD PREPARATION TECHNIQUES  Never deep-fry. If you must fry, either stir-fry, which uses very little fat, or use  non-stick cooking sprays. When possible, broil, bake, or roast meats, and steam vegetables. Instead of putting butter or margarine on vegetables, use lemon and herbs, applesauce, and cinnamon (for squash and sweet potatoes). Use nonfat yogurt, salsa, and low-fat dressings for salads.   LOW-SATURATED FAT / LOW-FAT FOOD SUBSTITUTES Meats / Saturated Fat (g)  Avoid: Steak, marbled (3 oz/85 g) / 11 g  Choose: Steak, lean (3 oz/85 g) / 4 g  Avoid: Hamburger (3 oz/85 g) / 7 g  Choose: Hamburger, lean (3 oz/85 g) / 5 g  Avoid: Ham (3 oz/85 g) / 6 g  Choose: Ham, lean cut (3 oz/85 g) / 2.4 g  Avoid: Chicken, with skin, dark meat (3 oz/85 g) / 4 g  Choose: Chicken, skin removed, dark meat (3 oz/85 g) / 2 g  Avoid: Chicken, with skin, light meat (3 oz/85 g) / 2.5 g  Choose: Chicken, skin removed, light meat (3 oz/85 g) / 1 g Dairy / Saturated Fat (g)  Avoid: Whole milk (1 cup) / 5 g  Choose: Low-fat milk, 2% (1 cup) / 3 g  Choose: Low-fat milk, 1% (1 cup) / 1.5 g  Choose: Skim milk (1 cup) / 0.3 g  Avoid: Hard cheese (1 oz/28 g) / 6 g  Choose: Skim milk cheese (1 oz/28 g) / 2 to 3 g  Avoid: Cottage cheese, 4% fat (1 cup) / 6.5 g  Choose: Low-fat cottage cheese, 1% fat (1 cup) / 1.5 g  Avoid: Ice cream (1 cup) / 9 g  Choose: Sherbet (1 cup) / 2.5 g  Choose: Nonfat frozen yogurt (1 cup) / 0.3 g  Choose: Frozen fruit bar / trace  Avoid: Whipped cream (1 tbs) / 3.5 g  Choose: Nondairy whipped topping (1 tbs) / 1 g Condiments / Saturated Fat (g)  Avoid: Mayonnaise (1 tbs) / 2 g  Choose: Low-fat mayonnaise (1 tbs) / 1 g  Avoid: Butter (1 tbs) / 7 g  Choose: Extra light margarine (1 tbs) / 1 g  Avoid: Coconut oil (1 tbs) / 11.8 g  Choose: Olive oil (1 tbs) / 1.8 g  Choose: Corn oil (1 tbs) / 1.7 g  Choose: Safflower oil (1 tbs) / 1.2 g  Choose: Sunflower oil (1 tbs) / 1.4 g  Choose: Soybean oil (1 tbs) / 2.4 g  Choose: Canola oil (1 tbs) / 1 g Document Released: 08/01/2005 Document Revised: 11/26/2012 Document Reviewed: 01/20/2011 [employer] Patient Information 2014 [place], [place].

## 2013-12-06 NOTE — Progress Notes (Signed)
Pre visit review using our clinic review tool, if applicable. No additional management support is needed unless otherwise documented below in the visit note. 

## 2013-12-08 NOTE — Assessment & Plan Note (Signed)
Pt declines dexa this year -may do it later Disc need for calcium/ vitamin D/ wt bearing exercise and bone density test every 2 y to monitor Disc safety/ fracture risk in detail  No fragility fractures Has a fam hx

## 2013-12-08 NOTE — Assessment & Plan Note (Signed)
Reviewed health habits including diet and exercise and skin cancer prevention Reviewed appropriate screening tests for age  Also reviewed health mt list, fam hx and immunization status , as well as social and family history   See HPI Labs rev   

## 2013-12-08 NOTE — Assessment & Plan Note (Signed)
Cholesterol is up a bit  Disc goals for lipids and reasons to control them Rev labs with pt Rev low sat fat diet in detail Given handout on diet Will cut back on shellfish Continue to follow

## 2014-07-18 ENCOUNTER — Other Ambulatory Visit: Payer: Self-pay

## 2014-07-18 DIAGNOSIS — Z1231 Encounter for screening mammogram for malignant neoplasm of breast: Secondary | ICD-10-CM

## 2014-08-25 ENCOUNTER — Ambulatory Visit
Admission: RE | Admit: 2014-08-25 | Discharge: 2014-08-25 | Disposition: A | Discharge status: Home / Self Care | Payer: Medicare Other | Source: Ambulatory Visit

## 2014-08-25 ENCOUNTER — Other Ambulatory Visit: Payer: Self-pay

## 2014-08-25 DIAGNOSIS — Z1231 Encounter for screening mammogram for malignant neoplasm of breast: Secondary | ICD-10-CM

## 2015-03-01 ENCOUNTER — Telehealth: Payer: Self-pay | Admitting: Family Medicine

## 2015-03-01 DIAGNOSIS — E785 Hyperlipidemia, unspecified: Secondary | ICD-10-CM

## 2015-03-01 DIAGNOSIS — Z0000 Encounter for general adult medical examination without abnormal findings: Secondary | ICD-10-CM | POA: Insufficient documentation

## 2015-03-01 NOTE — Telephone Encounter (Signed)
-----   Message from Ellamae Sia sent at 02/23/2015 10:41 AM EDT ----- Regarding: Lab orders for Monday, 7.18.16 Patient is scheduled for CPX labs, please order future labs, Thanks , Karna Christmas

## 2015-03-02 ENCOUNTER — Other Ambulatory Visit (INDEPENDENT_AMBULATORY_CARE_PROVIDER_SITE_OTHER): Payer: Medicare Other

## 2015-03-02 DIAGNOSIS — E785 Hyperlipidemia, unspecified: Secondary | ICD-10-CM

## 2015-03-02 DIAGNOSIS — Z0000 Encounter for general adult medical examination without abnormal findings: Secondary | ICD-10-CM

## 2015-03-02 LAB — LIPID PANEL
CHOL/HDL RATIO: 4
CHOLESTEROL: 228 mg/dL — AB (ref 0–200)
HDL: 52.1 mg/dL (ref >39.00)
LDL Cholesterol: 152 mg/dL — ABNORMAL HIGH (ref 0–99)
NONHDL: 175.9
Triglycerides: 118 mg/dL (ref 0.0–149.0)
VLDL: 23.6 mg/dL (ref 0.0–40.0)

## 2015-03-02 LAB — COMPREHENSIVE METABOLIC PANEL
ALBUMIN: 4.3 g/dL (ref 3.5–5.2)
ALK PHOS: 40 U/L (ref 39–117)
ALT: 21 U/L (ref 0–35)
AST: 21 U/L (ref 0–37)
BILIRUBIN TOTAL: 0.6 mg/dL (ref 0.2–1.2)
BUN: 19 mg/dL (ref 6–23)
CO2: 27 mEq/L (ref 19–32)
Calcium: 9.3 mg/dL (ref 8.4–10.5)
Chloride: 103 mEq/L (ref 96–112)
Creatinine, Ser: 0.91 mg/dL (ref 0.40–1.20)
GFR: 65.22 mL/min (ref >60.00)
Glucose, Bld: 92 mg/dL (ref 70–99)
Potassium: 4.1 mEq/L (ref 3.5–5.1)
Sodium: 138 mEq/L (ref 135–145)
Total Protein: 7.2 g/dL (ref 6.0–8.3)

## 2015-03-02 LAB — CBC WITH DIFFERENTIAL/PLATELET
BASOS ABS: 0 10*3/uL (ref 0.0–0.1)
Basophils Relative: 0.5 % (ref 0.0–3.0)
EOS ABS: 0.1 10*3/uL (ref 0.0–0.7)
EOS PCT: 2.1 % (ref 0.0–5.0)
HEMATOCRIT: 37.4 % (ref 36.0–46.0)
Hemoglobin: 12.6 g/dL (ref 12.0–15.0)
Lymphocytes Relative: 45.6 % (ref 12.0–46.0)
Lymphs Abs: 1.9 10*3/uL (ref 0.7–4.0)
MCHC: 33.8 g/dL (ref 30.0–36.0)
MCV: 90.6 fl (ref 78.0–100.0)
MONO ABS: 0.4 10*3/uL (ref 0.1–1.0)
Monocytes Relative: 10.5 % (ref 3.0–12.0)
NEUTROS PCT: 41.3 % — AB (ref 43.0–77.0)
Neutro Abs: 1.7 10*3/uL (ref 1.4–7.7)
PLATELETS: 306 10*3/uL (ref 150.0–400.0)
RBC: 4.12 Mil/uL (ref 3.87–5.11)
RDW: 13.7 % (ref 11.5–15.5)
WBC: 4.1 10*3/uL (ref 4.0–10.5)

## 2015-03-02 LAB — TSH: TSH: 1.67 u[IU]/mL (ref 0.35–4.50)

## 2015-03-09 ENCOUNTER — Ambulatory Visit (INDEPENDENT_AMBULATORY_CARE_PROVIDER_SITE_OTHER): Payer: Medicare Other | Admitting: Family Medicine

## 2015-03-09 ENCOUNTER — Encounter: Payer: Self-pay | Admitting: Family Medicine

## 2015-03-09 VITALS — BP 116/76 | HR 60 | Temp 98.5°F | Ht 59.5 in | Wt 134.5 lb

## 2015-03-09 DIAGNOSIS — E2839 Other primary ovarian failure: Secondary | ICD-10-CM

## 2015-03-09 DIAGNOSIS — M899 Disorder of bone, unspecified: Secondary | ICD-10-CM

## 2015-03-09 DIAGNOSIS — M949 Disorder of cartilage, unspecified: Secondary | ICD-10-CM

## 2015-03-09 DIAGNOSIS — E785 Hyperlipidemia, unspecified: Secondary | ICD-10-CM

## 2015-03-09 DIAGNOSIS — Z0000 Encounter for general adult medical examination without abnormal findings: Secondary | ICD-10-CM

## 2015-03-09 NOTE — Progress Notes (Signed)
Pre visit review using our clinic review tool, if applicable. No additional management support is needed unless otherwise documented below in the visit note. 

## 2015-03-09 NOTE — Assessment & Plan Note (Signed)
Osteopenia 5/12  dexa ordered  No new fractures   Disc need for calcium/ vitamin D/ wt bearing exercise and bone density test every 2 y to monitor Disc safety/ fracture risk in detail

## 2015-03-09 NOTE — Patient Instructions (Signed)
Stop at check out for referral for a bone density test  Take care of yourself  Keep watching diet for cholesterol  Avoid red meat/ fried foods/ egg yolks/ fatty breakfast meats/ butter, cheese and high fat dairy/ and shellfish

## 2015-03-09 NOTE — Assessment & Plan Note (Signed)
Reviewed health habits including diet and exercise and skin cancer prevention Reviewed appropriate screening tests for age  Also reviewed health mt list, fam hx and immunization status , as well as social and family history   See HPI Labs reviewed  Ref for dexa

## 2015-03-09 NOTE — Assessment & Plan Note (Signed)
Disc goals for lipids and reasons to control them Rev labs with pt Rev low sat fat diet in detail Both HDL and LDL went up  Pt declines medication for now -will continue to follow  Pt given handout re: low fat diet

## 2015-03-09 NOTE — Progress Notes (Signed)
Subjective:    Patient ID: Melanie Marshall, female    DOB: 1945/10/07, 69 y.o.   MRN: [id number]  HPI Here for annual medicare wellness visit as well as chronic/acute medical problems as well as annual preventative exam   I have personally reviewed the Medicare Annual Wellness questionnaire and have noted 1. The patient's medical and social history 2. Their use of alcohol, tobacco or illicit drugs 3. Their current medications and supplements 4. The patient's functional ability including ADL's, fall risks, home safety risks and hearing or visual             impairment. 5. Diet and physical activities 6. Evidence for depression or mood disorders  The patients weight, height, BMI have been recorded in the chart and visual acuity is per eye clinic.  I have made referrals, counseling and provided education to the patient based review of the above and I have provided the pt with a written personalized care plan for preventive services. Reviewed and updated provider list, see scanned forms.  Doing well overall  More aches and pains as she gets older   Wt is up 3 lb with bmi of 26  Takes care of herself   See scanned forms.  Routine anticipatory guidance given to patient.  See health maintenance. Colon cancer screening 3/13 - 10 year f/u  Breast cancer screening mammogram 1/16 -nl  Self breast exam-no lumps  Flu vaccine- had it in the fall of 2015  Tetanus vaccine 10/08 Pneumovax - complete on both  Zoster vaccine 11/13  dexa 5/12 osteopenia / on ca and D, (one fall over a year ago and broke her nose) - is ready to get her dexa  Advance directive-has a living will and POA  Cognitive function addressed- see scanned forms- and if abnormal then additional documentation follows. No concerns    One fall out of town- caught toe of shoe in sidewalk    [place] and [place] reviewed  Meds, vitals, and allergies reviewed.   ROS: See HPI.  Otherwise negative.    Hyperlipidemia Lab Results    Component Value Date   CHOL 228* 03/02/2015   CHOL 219* 12/02/2013   CHOL 204* 02/21/2013   Lab Results  Component Value Date   HDL 52.10 03/02/2015   HDL 44.40 12/02/2013   HDL 49.10 02/21/2013   Lab Results  Component Value Date   LDLCALC 152* 03/02/2015   LDLCALC 146* 12/02/2013   Lab Results  Component Value Date   TRIG 118.0 03/02/2015   TRIG 142.0 12/02/2013   TRIG 151.0* 02/21/2013   Lab Results  Component Value Date   CHOLHDL 4 03/02/2015   CHOLHDL 5 12/02/2013   CHOLHDL 4 02/21/2013   Lab Results  Component Value Date   LDLDIRECT 138.8 02/21/2013   LDLDIRECT 193.6 11/15/2012   LDLDIRECT 143.8 09/21/2011   pt declines medication  HDL went up and LDL is up a bit  Is very good with diet in general except for cheese -working on that    Results for orders placed or performed in visit on 03/02/15  CBC with Differential/Platelet  Result Value Ref Range   WBC 4.1 4.0 - 10.5 K/uL   RBC 4.12 3.87 - 5.11 Mil/uL   Hemoglobin 12.6 12.0 - 15.0 g/dL   HCT 37.4 36.0 - 46.0 %   MCV 90.6 78.0 - 100.0 fl   MCHC 33.8 30.0 - 36.0 g/dL   RDW 13.7 11.5 - 15.5 %   Platelets 306.0 150.0 -  400.0 K/uL   Neutrophils Relative % 41.3 (L) 43.0 - 77.0 %   Lymphocytes Relative 45.6 12.0 - 46.0 %   Monocytes Relative 10.5 3.0 - 12.0 %   Eosinophils Relative 2.1 0.0 - 5.0 %   Basophils Relative 0.5 0.0 - 3.0 %   Neutro Abs 1.7 1.4 - 7.7 K/uL   Lymphs Abs 1.9 0.7 - 4.0 K/uL   Monocytes Absolute 0.4 0.1 - 1.0 K/uL   Eosinophils Absolute 0.1 0.0 - 0.7 K/uL   Basophils Absolute 0.0 0.0 - 0.1 K/uL  Comprehensive metabolic panel  Result Value Ref Range   Sodium 138 135 - 145 mEq/L   Potassium 4.1 3.5 - 5.1 mEq/L   Chloride 103 96 - 112 mEq/L   CO2 27 19 - 32 mEq/L   Glucose, Bld 92 70 - 99 mg/dL   BUN 19 6 - 23 mg/dL   Creatinine, Ser 0.91 0.40 - 1.20 mg/dL   Total Bilirubin 0.6 0.2 - 1.2 mg/dL   Alkaline Phosphatase 40 39 - 117 U/L   AST 21 0 - 37 U/L   ALT 21 0 - 35 U/L    Total Protein 7.2 6.0 - 8.3 g/dL   Albumin 4.3 3.5 - 5.2 g/dL   Calcium 9.3 8.4 - 10.5 mg/dL   GFR 65.22 >60.00 mL/min  Lipid panel  Result Value Ref Range   Cholesterol 228 (H) 0 - 200 mg/dL   Triglycerides 118.0 0.0 - 149.0 mg/dL   HDL 52.10 >39.00 mg/dL   VLDL 23.6 0.0 - 40.0 mg/dL   LDL Cholesterol 152 (H) 0 - 99 mg/dL   Total CHOL/HDL Ratio 4    NonHDL 175.90   TSH  Result Value Ref Range   TSH 1.67 0.35 - 4.50 uIU/mL     Patient Active Problem List   Diagnosis Date Noted  . Routine general medical examination at a health care facility 03/01/2015  . Seborrheic keratosis, inflamed 03/01/2013  . Encounter for Medicare annual wellness exam 11/14/2012  . Special screening for malignant neoplasms, colon 09/23/2011  . NEUTROPENIA UNSPECIFIED 07/23/2010  . Hyperlipidemia 06/24/2008  . ALLERGIC RHINITIS 04/17/2007  . Disorder of bone and cartilage 04/17/2007  . MURMUR 04/17/2007  . MIGRAINES, HX OF 04/17/2007   Past Medical History  Diagnosis Date  . Allergy     allergic rhinitis  . Osteopenia   . Hyperlipidemia   . Arthritis   . History of blood transfusion 1970    after childbirth  . Heart murmur     not treated   Past Surgical History  Procedure Laterality Date  . Tubal ligation  1979   History  Substance Use Topics  . Smoking status: Former Smoker    Quit date: 08/16/1967  . Smokeless tobacco: Never Used  . Alcohol Use: 0.6 oz/week    1 Glasses of wine per week     Comment: occ   Family History  Problem Relation Age of Onset  . Diabetes Mother   . Heart disease Mother     MI  . Hypertension Father     ??HTN  . Hyperlipidemia Father   . Cancer Father     pancreatic cancer  . Pancreatic cancer Father   . Hypertension Brother   . Cancer Paternal Grandmother     breast CA  . Colon cancer Neg Hx   . Stomach cancer Neg Hx    No Known Allergies Current Outpatient Prescriptions on File Prior to Visit  Medication Sig Dispense Refill  .  ibuprofen  (ADVIL,MOTRIN) 200 MG tablet Take 200 mg by mouth every 6 (six) hours as needed.     No current facility-administered medications on file prior to visit.     Review of Systems Review of Systems  Constitutional: Negative for fever, appetite change, fatigue and unexpected weight change.  Eyes: Negative for pain and visual disturbance.  Respiratory: Negative for cough and shortness of breath.   Cardiovascular: Negative for cp or palpitations    Gastrointestinal: Negative for nausea, diarrhea and constipation.  Genitourinary: Negative for urgency and frequency.  Skin: Negative for pallor or rash   Neurological: Negative for weakness, light-headedness, numbness and headaches.  Hematological: Negative for adenopathy. Does not bruise/bleed easily.  Psychiatric/Behavioral: Negative for dysphoric mood. The patient is not nervous/anxious.         Objective:   Physical Exam  Constitutional: She appears well-developed and well-nourished. No distress.  Well appearing   HENT:  Head: Normocephalic and atraumatic.  Right Ear: External ear normal.  Left Ear: External ear normal.  Mouth/Throat: Oropharynx is clear and moist.  Eyes: Conjunctivae and EOM are normal. Pupils are equal, round, and reactive to light. No scleral icterus.  Neck: Normal range of motion. Neck supple. No JVD present. Carotid bruit is not present. No thyromegaly present.  Cardiovascular: Normal rate, regular rhythm and intact distal pulses.  Exam reveals no gallop.   Murmur heard. Pulmonary/Chest: Effort normal and breath sounds normal. No respiratory distress. She has no wheezes. She exhibits no tenderness.  Abdominal: Soft. Bowel sounds are normal. She exhibits no distension, no abdominal bruit and no mass. There is no tenderness.  Genitourinary: No breast swelling, tenderness, discharge or bleeding.  Breast exam: No mass, nodules, thickening, tenderness, bulging, retraction, inflamation, nipple discharge or skin changes  noted.  No axillary or clavicular LA.      Musculoskeletal: Normal range of motion. She exhibits no edema or tenderness.  No kyphosis   Lymphadenopathy:    She has no cervical adenopathy.  Neurological: She is alert. She has normal reflexes. No cranial nerve deficit. She exhibits normal muscle tone. Coordination normal.  Skin: Skin is warm and dry. No rash noted. No erythema. No pallor.  Psychiatric: She has a normal mood and affect.          Assessment & Plan:   Problem List Items Addressed This Visit    Disorder of bone and cartilage    Osteopenia 5/12  dexa ordered  No new fractures   Disc need for calcium/ vitamin D/ wt bearing exercise and bone density test every 2 y to monitor Disc safety/ fracture risk in detail        Encounter for Medicare annual wellness exam - Primary    Reviewed health habits including diet and exercise and skin cancer prevention Reviewed appropriate screening tests for age  Also reviewed health mt list, fam hx and immunization status , as well as social and family history   See HPI Labs reviewed  Ref for dexa        Estrogen deficiency   Relevant Orders   DG Bone Density   Hyperlipidemia    Disc goals for lipids and reasons to control them Rev labs with pt Rev low sat fat diet in detail Both HDL and LDL went up  Pt declines medication for now -will continue to follow  Pt given handout re: low fat diet       Routine general medical examination at a health care facility  Reviewed health habits including diet and exercise and skin cancer prevention Reviewed appropriate screening tests for age  Also reviewed health mt list, fam hx and immunization status , as well as social and family history   See HPI Labs reviewed  Ref for dexa

## 2015-03-12 ENCOUNTER — Ambulatory Visit
Admission: RE | Admit: 2015-03-12 | Discharge: 2015-03-12 | Disposition: A | Discharge status: Home / Self Care | Payer: Medicare Other | Source: Ambulatory Visit | Attending: Family Medicine | Admitting: Family Medicine

## 2015-03-12 DIAGNOSIS — E2839 Other primary ovarian failure: Secondary | ICD-10-CM

## 2015-07-21 ENCOUNTER — Other Ambulatory Visit: Payer: Self-pay

## 2015-07-21 DIAGNOSIS — Z1231 Encounter for screening mammogram for malignant neoplasm of breast: Secondary | ICD-10-CM

## 2015-09-24 ENCOUNTER — Ambulatory Visit
Admission: RE | Admit: 2015-09-24 | Discharge: 2015-09-24 | Disposition: A | Discharge status: Home / Self Care | Payer: Medicare Other | Source: Ambulatory Visit

## 2015-09-24 DIAGNOSIS — Z1231 Encounter for screening mammogram for malignant neoplasm of breast: Secondary | ICD-10-CM

## 2016-02-28 ENCOUNTER — Telehealth: Payer: Self-pay | Admitting: Family Medicine

## 2016-02-28 DIAGNOSIS — Z0000 Encounter for general adult medical examination without abnormal findings: Secondary | ICD-10-CM

## 2016-02-28 NOTE — Telephone Encounter (Signed)
-----   Message from Ellamae Sia sent at 02/25/2016 10:25 AM EDT ----- Regarding: Lab orders for Friday, 7.21.17 Patient is scheduled for CPX labs, please order future labs, Thanks , Karna Christmas

## 2016-03-04 ENCOUNTER — Other Ambulatory Visit: Payer: Medicare Other

## 2016-03-10 ENCOUNTER — Ambulatory Visit (INDEPENDENT_AMBULATORY_CARE_PROVIDER_SITE_OTHER): Payer: Medicare Other

## 2016-03-10 ENCOUNTER — Other Ambulatory Visit (INDEPENDENT_AMBULATORY_CARE_PROVIDER_SITE_OTHER): Payer: Medicare Other

## 2016-03-10 VITALS — BP 120/78 | HR 64 | Temp 98.1°F | Ht 59.5 in | Wt 140.2 lb

## 2016-03-10 DIAGNOSIS — Z0000 Encounter for general adult medical examination without abnormal findings: Secondary | ICD-10-CM

## 2016-03-10 LAB — LIPID PANEL
Cholesterol: 218 mg/dL — ABNORMAL HIGH (ref 0–200)
HDL: 48.7 mg/dL (ref >39.00)
LDL Cholesterol: 131 mg/dL — ABNORMAL HIGH (ref 0–99)
NonHDL: 169.25
Total CHOL/HDL Ratio: 4
Triglycerides: 192 mg/dL — ABNORMAL HIGH (ref 0.0–149.0)
VLDL: 38.4 mg/dL (ref 0.0–40.0)

## 2016-03-10 LAB — CBC WITH DIFFERENTIAL/PLATELET
BASOS PCT: 0.3 % (ref 0.0–3.0)
Basophils Absolute: 0 10*3/uL (ref 0.0–0.1)
Eosinophils Absolute: 0 10*3/uL (ref 0.0–0.7)
Eosinophils Relative: 0.6 % (ref 0.0–5.0)
HCT: 37.5 % (ref 36.0–46.0)
Hemoglobin: 12.6 g/dL (ref 12.0–15.0)
LYMPHS ABS: 1.6 10*3/uL (ref 0.7–4.0)
LYMPHS PCT: 29.4 % (ref 12.0–46.0)
MCHC: 33.5 g/dL (ref 30.0–36.0)
MCV: 89.5 fl (ref 78.0–100.0)
Monocytes Absolute: 0.4 10*3/uL (ref 0.1–1.0)
Monocytes Relative: 7.5 % (ref 3.0–12.0)
NEUTROS ABS: 3.3 10*3/uL (ref 1.4–7.7)
NEUTROS PCT: 62.2 % (ref 43.0–77.0)
PLATELETS: 340 10*3/uL (ref 150.0–400.0)
RBC: 4.2 Mil/uL (ref 3.87–5.11)
RDW: 13.9 % (ref 11.5–15.5)
WBC: 5.4 10*3/uL (ref 4.0–10.5)

## 2016-03-10 LAB — COMPREHENSIVE METABOLIC PANEL
ALK PHOS: 44 U/L (ref 39–117)
ALT: 13 U/L (ref 0–35)
AST: 19 U/L (ref 0–37)
Albumin: 4.6 g/dL (ref 3.5–5.2)
BILIRUBIN TOTAL: 0.5 mg/dL (ref 0.2–1.2)
BUN: 21 mg/dL (ref 6–23)
CO2: 30 meq/L (ref 19–32)
Calcium: 9.7 mg/dL (ref 8.4–10.5)
Chloride: 104 mEq/L (ref 96–112)
Creatinine, Ser: 0.97 mg/dL (ref 0.40–1.20)
GFR: 60.41 mL/min (ref >60.00)
Glucose, Bld: 97 mg/dL (ref 70–99)
Potassium: 3.9 mEq/L (ref 3.5–5.1)
Sodium: 142 mEq/L (ref 135–145)
TOTAL PROTEIN: 7.6 g/dL (ref 6.0–8.3)

## 2016-03-10 LAB — TSH: TSH: 0.98 u[IU]/mL (ref 0.35–4.50)

## 2016-03-10 NOTE — Progress Notes (Signed)
Medical screening examination/treatment/procedure(s) were performed by registered nurse and as supervising non-physician practitioner, I was immediately available for consultation/collaboration.   BAITY, REGINA, NP  

## 2016-03-10 NOTE — Progress Notes (Signed)
Pre visit review using our clinic review tool, if applicable. No additional management support is needed unless otherwise documented below in the visit note. 

## 2016-03-10 NOTE — Progress Notes (Signed)
PCP notes:   Health maintenance:   No gaps identified or addressed   Abnormal screenings:   Hearing - failed  Patient concerns:   Pt has small raised area to front of head near hairline. Pt noticed area approx. 1 yr ago. Pt denies area has had bleeding, discharge, pain, change in appearance, or change in circumference.   Nurse concerns:  None  Next PCP appt:   03/11/16 @ 1030

## 2016-03-10 NOTE — Patient Instructions (Signed)
Melanie Marshall , Thank you for taking time to come for your Medicare Wellness Visit. I appreciate your ongoing commitment to your health goals. Please review the following plan we discussed and let me know if I can assist you in the future.   These are the goals we discussed: Goals    . Increase physical activity          Starting 03/10/2016, I will continue to exercise for at least 60 min 2-4 days per week.        This is a list of the screening recommended for you and due dates:  Health Maintenance  Topic Date Due  .  Hepatitis C: One time screening is recommended by Center for Disease Control  (CDC) for  adults born from [birth year] through 1965.   09/16/2020*  . Flu Shot  03/15/2016  . Mammogram  09/23/2016  . Tetanus Vaccine  05/17/2017  . Colon Cancer Screening  10/30/2021  . DEXA scan (bone density measurement)  Completed  . Shingles Vaccine  Addressed  . Pneumonia vaccines  Completed  *Topic was postponed. The date shown is not the original due date.   Preventive Care for Adults  A healthy lifestyle and preventive care can promote health and wellness. Preventive health guidelines for adults include the following key practices.  . A routine yearly physical is a good way to check with your health care provider about your health and preventive screening. It is a chance to share any concerns and updates on your health and to receive a thorough exam.  . Visit your dentist for a routine exam and preventive care every 6 months. Brush your teeth twice a day and floss once a day. Good oral hygiene prevents tooth decay and gum disease.  . The frequency of eye exams is based on your age, health, family medical history, use  of contact lenses, and other factors. Follow your health care provider's ecommendations for frequency of eye exams.  . Eat a healthy diet. Foods like vegetables, fruits, whole grains, low-fat dairy products, and lean protein foods contain the nutrients you need without too  many calories. Decrease your intake of foods high in solid fats, added sugars, and salt. Eat the right amount of calories for you. Get information about a proper diet from your health care provider, if necessary.  . Regular physical exercise is one of the most important things you can do for your health. Most adults should get at least 150 minutes of moderate-intensity exercise (any activity that increases your heart rate and causes you to sweat) each week. In addition, most adults need muscle-strengthening exercises on 2 or more days a week.  Silver Sneakers may be a benefit available to you. To determine eligibility, you may visit the website: www.silversneakers.com or contact program at (760)788-8802 Mon-Fri between 8AM-8PM.   . Maintain a healthy weight. The body mass index (BMI) is a screening tool to identify possible weight problems. It provides an estimate of body fat based on height and weight. Your health care provider can find your BMI and can help you achieve or maintain a healthy weight.   For adults 20 years and older: ? A BMI below 18.5 is considered underweight. ? A BMI of 18.5 to 24.9 is normal. ? A BMI of 25 to 29.9 is considered overweight. ? A BMI of 30 and above is considered obese.   . Maintain normal blood lipids and cholesterol levels by exercising and minimizing your intake of saturated fat. Eat  a balanced diet with plenty of fruit and vegetables. Blood tests for lipids and cholesterol should begin at age 48 and be repeated every 5 years. If your lipid or cholesterol levels are high, you are over 50, or you are at high risk for heart disease, you may need your cholesterol levels checked more frequently. Ongoing high lipid and cholesterol levels should be treated with medicines if diet and exercise are not working.  . If you smoke, find out from your health care provider how to quit. If you do not use tobacco, please do not start.  . If you choose to drink alcohol, please  do not consume more than 2 drinks per day. One drink is considered to be 12 ounces (355 mL) of beer, 5 ounces (148 mL) of wine, or 1.5 ounces (44 mL) of liquor.  . If you are 54-75 years old, ask your health care provider if you should take aspirin to prevent strokes.  . Use sunscreen. Apply sunscreen liberally and repeatedly throughout the day. You should seek shade when your shadow is shorter than you. Protect yourself by wearing long sleeves, pants, a wide-brimmed hat, and sunglasses year round, whenever you are outdoors.  . Once a month, do a whole body skin exam, using a mirror to look at the skin on your back. Tell your health care provider of new moles, moles that have irregular borders, moles that are larger than a pencil eraser, or moles that have changed in shape or color.

## 2016-03-10 NOTE — Progress Notes (Signed)
Subjective:   Melanie Marshall is a 70 y.o. female who presents for Medicare Annual (Subsequent) preventive examination.  Review of Systems:  N/A Cardiac Risk Factors include: advanced age (>32men, >39 women);dyslipidemia     Objective:     Vitals: BP 120/78   Pulse 64   Temp 98.1 F (36.7 C) (Oral)   Ht 4' 11.5" (1.511 m) Comment: no shoes  Wt 140 lb 4 oz (63.6 kg)   SpO2 99%   BMI 27.85 kg/m   Body mass index is 27.85 kg/m.   Tobacco History  Smoking Status  . Former Smoker  . Quit date: 08/16/1967  Smokeless Tobacco  . Never Used     Counseling given: No   Past Medical History:  Diagnosis Date  . Allergy    allergic rhinitis  . Arthritis   . Heart murmur    not treated  . History of blood transfusion 1970   after childbirth  . Hyperlipidemia   . Osteopenia    Past Surgical History:  Procedure Laterality Date  . TUBAL LIGATION  1979   Family History  Problem Relation Age of Onset  . Diabetes Mother   . Heart disease Mother     MI  . Hypertension Father     ??HTN  . Hyperlipidemia Father   . Cancer Father     pancreatic cancer  . Pancreatic cancer Father   . Hypertension Brother   . Cancer Paternal Grandmother     breast CA  . Colon cancer Neg Hx   . Stomach cancer Neg Hx    History  Sexual Activity  . Sexual activity: No    Outpatient Encounter Prescriptions as of 03/10/2016  Medication Sig  . Cholecalciferol (VITAMIN D-3) 1000 UNITS CAPS Take 1 capsule by mouth daily.  [name] Kitchen ibuprofen (ADVIL,MOTRIN) 200 MG tablet Take 200 mg by mouth every 6 (six) hours as needed.  . Multiple Vitamin (MULTIVITAMIN) capsule Take 1 capsule by mouth daily.   No facility-administered encounter medications on file as of 03/10/2016.     Activities of Daily Living In your present state of health, do you have any difficulty performing the following activities: 03/10/2016  Hearing? N  Vision? N  Difficulty concentrating or making decisions? N  Walking or  climbing stairs? N  Dressing or bathing? N  Doing errands, shopping? N  Preparing Food and eating ? N  Using the Toilet? N  In the past six months, have you accidently leaked urine? N  Do you have problems with loss of bowel control? N  Managing your Medications? N  Managing your Finances? N  Housekeeping or managing your Housekeeping? N  Some recent data might be hidden    Patient Care Team: Abner Greenspan, MD as PCP - General Conley Simmonds, DDS as Referring Physician (Dentistry) Monna Fam, MD as Consulting Physician (Ophthalmology)    Assessment:     Hearing Screening   125Hz  250Hz  500Hz  1000Hz  2000Hz  3000Hz  4000Hz  6000Hz  8000Hz   Right ear:   40 0 40  40    Left ear:   40 0 40  40    Vision Screening Comments: Last vision exam in Oct 2016   Exercise Activities and Dietary recommendations Current Exercise Habits: Home exercise routine, Type of exercise: strength training/weights;Other - see comments (cardio), Time (Minutes): 60, Frequency (Times/Week): 4, Weekly Exercise (Minutes/Week): 240, Intensity: Moderate, Exercise limited by: None identified  Goals    . Increase physical activity  Starting 03/10/2016, I will continue to exercise for at least 60 min 2-4 days per week.       Fall Risk Fall Risk  03/10/2016 03/09/2015 12/06/2013 11/21/2012  Falls in the past year? No Yes Yes No  Number falls in past yr: - 1 1 -  Injury with Fall? - Yes No -   Depression Screen PHQ 2/9 Scores 03/10/2016 03/09/2015 12/06/2013 11/21/2012  PHQ - 2 Score 0 0 0 0     Cognitive Testing MMSE - Mini Mental State Exam 03/10/2016  Orientation to time 5  Orientation to Place 5  Registration 3  Attention/ Calculation 0  Recall 3  Language- name 2 objects 0  Language- repeat 1  Language- follow 3 step command 3  Language- read & follow direction 0  Write a sentence 0  Copy design 0  Total score 20   PLEASE NOTE: A Mini-Cog screen was completed. Maximum score is 20. A value of 0  denotes this part of [place] MMSE was not completed or the patient failed this part of the Mini-Cog screening.   Mini-Cog Screening Orientation to Time - Max 5 pts Orientation to Place - Max 5 pts Registration - Max 3 pts Recall - Max 3 pts Language Repeat - Max 1 pts Language Follow 3 Step Command - Max 3 pts  Immunization History  Administered Date(s) Administered  . Hepatitis A 06/24/2008  . Influenza Split 06/10/2011  . Influenza Whole 07/23/2010  . Influenza, High Dose Seasonal PF 05/08/2015  . Influenza-Unspecified 05/15/2013, 05/16/2014  . Pneumococcal Conjugate-13 12/06/2013  . Pneumococcal Polysaccharide-23 09/23/2011  . Td 05/18/2007  . Zoster 06/29/2012   Screening Tests Health Maintenance  Topic Date Due  . Hepatitis C Screening  09/16/2020 (Originally [birth year])  . INFLUENZA VACCINE  03/15/2016  . MAMMOGRAM  09/23/2016  . TETANUS/TDAP  05/17/2017  . COLONOSCOPY  10/30/2021  . DEXA SCAN  Completed  . ZOSTAVAX  Addressed  . PNA vac Low Risk Adult  Completed      Plan:      I have personally reviewed and addressed the Medicare Annual Wellness questionnaire and have noted the following in the patient's chart:  A. Medical and social history B. Use of alcohol, tobacco or illicit drugs  C. Current medications and supplements D. Functional ability and status E.  Nutritional status F.  Physical activity G. Advance directives H. List of other physicians I.  Hospitalizations, surgeries, and ER visits in previous 12 months J.  [place] to include hearing, vision, cognitive, depression L. Referrals and appointments - none  In addition, I have reviewed and discussed with patient certain preventive protocols, quality metrics, and best practice recommendations. A written personalized care plan for preventive services as well as general preventive health recommendations were provided to patient.  See attached scanned questionnaire for additional  information.   Signed,   Lindell Noe, MHA, BS, LPN Health Advisor

## 2016-03-11 ENCOUNTER — Ambulatory Visit (INDEPENDENT_AMBULATORY_CARE_PROVIDER_SITE_OTHER): Payer: Medicare Other | Admitting: Family Medicine

## 2016-03-11 ENCOUNTER — Encounter: Payer: Self-pay | Admitting: Family Medicine

## 2016-03-11 VITALS — BP 140/68 | HR 62 | Temp 98.2°F | Ht 59.5 in | Wt 141.5 lb

## 2016-03-11 DIAGNOSIS — M899 Disorder of bone, unspecified: Secondary | ICD-10-CM | POA: Diagnosis not present

## 2016-03-11 DIAGNOSIS — E785 Hyperlipidemia, unspecified: Secondary | ICD-10-CM

## 2016-03-11 DIAGNOSIS — M949 Disorder of cartilage, unspecified: Secondary | ICD-10-CM

## 2016-03-11 DIAGNOSIS — Z0000 Encounter for general adult medical examination without abnormal findings: Secondary | ICD-10-CM

## 2016-03-11 NOTE — Progress Notes (Signed)
Subjective:    Patient ID: [name], female    DOB: 22-Oct-1945, 70 y.o.   MRN: [id number]  HPI Here for health maintenance exam and to review chronic medical problems    Rough summer for family - miscarriage and cancer (brother)- ? Pancreatic with mets  Is stressed  She herself has felt ok   Wt Readings from Last 3 Encounters:  03/11/16 141 lb 8 oz (64.2 kg)  03/10/16 140 lb 4 oz (63.6 kg)  03/09/15 134 lb 8 oz (61 kg)  bmi is 28 Eating healthy and getting exercise/ goes to silver sneakers classes    She had AMW visit yesterday Failed hearing exam (at 1000 Hz and 3000 Hz) Pt has not noticed problems - will watch it  No other concerns  Hep C screening -has not been screened  Declines testing at this time   Mammogram 2/17-negative  Self breast exam - no lumps or changes   Tetanus shot 10/08  Colonoscopy 3/13-nl with 10 year recall  No bowel changes   dexa 7/16 Fairly stable osteopenia worse at [place]-none Fracture hx -has fractured her nose , and tibia (hairline fx)- many years ago  No fragility fractures -those were both [place]  Takes ca and D - intermittently (they give her stomach upset)  Has not tried D alone   Completed other imms   Hx of hyperlipidemia Lab Results  Component Value Date   CHOL 218 (H) 03/10/2016   CHOL 228 (H) 03/02/2015   CHOL 219 (H) 12/02/2013   Lab Results  Component Value Date   HDL 48.70 03/10/2016   HDL 52.10 03/02/2015   HDL 44.40 12/02/2013   Lab Results  Component Value Date   LDLCALC 131 (H) 03/10/2016   LDLCALC 152 (H) 03/02/2015   LDLCALC 146 (H) 12/02/2013   Lab Results  Component Value Date   TRIG 192.0 (H) 03/10/2016   TRIG 118.0 03/02/2015   TRIG 142.0 12/02/2013   Lab Results  Component Value Date   CHOLHDL 4 03/10/2016   CHOLHDL 4 03/02/2015   CHOLHDL 5 12/02/2013   Lab Results  Component Value Date   LDLDIRECT 138.8 02/21/2013   LDLDIRECT 193.6 11/15/2012   LDLDIRECT 143.8  09/21/2011     Stable with dec LDL and inc triglycerides  Has eaten more cheese lately  Avoids red meat and shellfish   Results for orders placed or performed in visit on 03/10/16  CBC with Differential/Platelet  Result Value Ref Range   WBC 5.4 4.0 - 10.5 K/uL   RBC 4.20 3.87 - 5.11 Mil/uL   Hemoglobin 12.6 12.0 - 15.0 g/dL   HCT 37.5 36.0 - 46.0 %   MCV 89.5 78.0 - 100.0 fl   MCHC 33.5 30.0 - 36.0 g/dL   RDW 13.9 11.5 - 15.5 %   Platelets 340.0 150.0 - 400.0 K/uL   Neutrophils Relative % 62.2 43.0 - 77.0 %   Lymphocytes Relative 29.4 12.0 - 46.0 %   Monocytes Relative 7.5 3.0 - 12.0 %   Eosinophils Relative 0.6 0.0 - 5.0 %   Basophils Relative 0.3 0.0 - 3.0 %   Neutro Abs 3.3 1.4 - 7.7 K/uL   Lymphs Abs 1.6 0.7 - 4.0 K/uL   Monocytes Absolute 0.4 0.1 - 1.0 K/uL   Eosinophils Absolute 0.0 0.0 - 0.7 K/uL   Basophils Absolute 0.0 0.0 - 0.1 K/uL  Comprehensive metabolic panel  Result Value Ref Range   Sodium 142 135 -  145 mEq/L   Potassium 3.9 3.5 - 5.1 mEq/L   Chloride 104 96 - 112 mEq/L   CO2 30 19 - 32 mEq/L   Glucose, Bld 97 70 - 99 mg/dL   BUN 21 6 - 23 mg/dL   Creatinine, Ser 0.97 0.40 - 1.20 mg/dL   Total Bilirubin 0.5 0.2 - 1.2 mg/dL   Alkaline Phosphatase 44 39 - 117 U/L   AST 19 0 - 37 U/L   ALT 13 0 - 35 U/L   Total Protein 7.6 6.0 - 8.3 g/dL   Albumin 4.6 3.5 - 5.2 g/dL   Calcium 9.7 8.4 - 10.5 mg/dL   GFR 60.41 >60.00 mL/min  Lipid panel  Result Value Ref Range   Cholesterol 218 (H) 0 - 200 mg/dL   Triglycerides 192.0 (H) 0.0 - 149.0 mg/dL   HDL 48.70 >39.00 mg/dL   VLDL 38.4 0.0 - 40.0 mg/dL   LDL Cholesterol 131 (H) 0 - 99 mg/dL   Total CHOL/HDL Ratio 4    NonHDL 169.25   TSH  Result Value Ref Range   TSH 0.98 0.35 - 4.50 uIU/mL    Patient Active Problem List   Diagnosis Date Noted  . Estrogen deficiency 03/09/2015  . Routine general medical examination at a health care facility 03/01/2015  . Seborrheic keratosis, inflamed 03/01/2013  .  Encounter for Medicare annual wellness exam 11/14/2012  . Special screening for malignant neoplasms, colon 09/23/2011  . NEUTROPENIA UNSPECIFIED 07/23/2010  . Hyperlipidemia 06/24/2008  . ALLERGIC RHINITIS 04/17/2007  . Disorder of bone and cartilage 04/17/2007  . MURMUR 04/17/2007  . MIGRAINES, HX OF 04/17/2007   Past Medical History:  Diagnosis Date  . Allergy    allergic rhinitis  . Arthritis   . Heart murmur    not treated  . History of blood transfusion 1970   after childbirth  . Hyperlipidemia   . Osteopenia    Past Surgical History:  Procedure Laterality Date  . TUBAL LIGATION  1979   Social History  Substance Use Topics  . Smoking status: Former Smoker    Quit date: 08/16/1967  . Smokeless tobacco: Never Used  . Alcohol use 0.6 oz/week    1 Glasses of wine per week     Comment: occ   Family History  Problem Relation Age of Onset  . Diabetes Mother   . Heart disease Mother     MI  . Hypertension Father     ??HTN  . Hyperlipidemia Father   . Cancer Father     pancreatic cancer  . Pancreatic cancer Father   . Hypertension Brother   . Cancer Brother     pancreastic (suspect)   . Cancer Paternal Grandmother     breast CA  . Colon cancer Neg Hx   . Stomach cancer Neg Hx    No Known Allergies Current Outpatient Prescriptions on File Prior to Visit  Medication Sig Dispense Refill  . Cholecalciferol (VITAMIN D-3) 1000 UNITS CAPS Take 1 capsule by mouth daily.    [name] Kitchen ibuprofen (ADVIL,MOTRIN) 200 MG tablet Take 200 mg by mouth every 6 (six) hours as needed.    . Multiple Vitamin (MULTIVITAMIN) capsule Take 1 capsule by mouth daily.     No current facility-administered medications on file prior to visit.     Review of Systems Review of Systems  Constitutional: Negative for fever, appetite change, fatigue and unexpected weight change.  Eyes: Negative for pain and visual disturbance.  Respiratory: Negative for cough  and shortness of breath.   Cardiovascular:  Negative for cp or palpitations    Gastrointestinal: Negative for nausea, diarrhea and constipation.  Genitourinary: Negative for urgency and frequency.  Skin: Negative for pallor or rash   Neurological: Negative for weakness, light-headedness, numbness and headaches.  Hematological: Negative for adenopathy. Does not bruise/bleed easily.  Psychiatric/Behavioral: Negative for dysphoric mood. The patient is not nervous/anxious.   Pos for stressors       Objective:   Physical Exam  Constitutional: She appears well-developed and well-nourished. No distress.  overwt and well app  HENT:  Head: Normocephalic and atraumatic.  Right Ear: External ear normal.  Left Ear: External ear normal.  Mouth/Throat: Oropharynx is clear and moist.  Eyes: Conjunctivae and EOM are normal. Pupils are equal, round, and reactive to light. No scleral icterus.  Neck: Normal range of motion. Neck supple. No JVD present. Carotid bruit is not present. No thyromegaly present.  Cardiovascular: Normal rate, regular rhythm and intact distal pulses.  Exam reveals no gallop.   Murmur heard. Pulmonary/Chest: Effort normal and breath sounds normal. No respiratory distress. She has no wheezes. She exhibits no tenderness.  Abdominal: Soft. Bowel sounds are normal. She exhibits no distension, no abdominal bruit and no mass. There is no tenderness.  Genitourinary: No breast swelling, tenderness, discharge or bleeding.  Genitourinary Comments: Breast exam: No mass, nodules, thickening, tenderness, bulging, retraction, inflamation, nipple discharge or skin changes noted.  No axillary or clavicular LA.      Musculoskeletal: Normal range of motion. She exhibits no edema or tenderness.  Lymphadenopathy:    She has no cervical adenopathy.  Neurological: She is alert. She has normal reflexes. No cranial nerve deficit. She exhibits normal muscle tone. Coordination normal.  Skin: Skin is warm and dry. No rash noted. No erythema. No  pallor.  Psychiatric: She has a normal mood and affect.          Assessment & Plan:   Problem List Items Addressed This Visit      Musculoskeletal and Integument   Disorder of bone and cartilage    Stable dexa No fragility fractures Disc need for calcium/ vitamin D/ wt bearing exercise and bone density test every 2 y to monitor Disc safety/ fracture risk in detail          Other   Routine general medical examination at a health care facility - Primary    Reviewed health habits including diet and exercise and skin cancer prevention Reviewed appropriate screening tests for age  Also reviewed health mt list, fam hx and immunization status , as well as social and family history   See HPI AMW reviewed Labs reviewed Try taking vitamin D 2000 iu daily with a meal for bones  Avoid red meat/ fried foods/ egg yolks/ fatty breakfast meats/ butter, cheese and high fat dairy/ and shellfish       Hyperlipidemia    Disc goals for lipids and reasons to control them Rev labs with pt Rev low sat fat diet in detail Will cut back on cheese/high fat dairy       Other Visit Diagnoses   None.

## 2016-03-11 NOTE — Progress Notes (Signed)
Pre visit review using our clinic review tool, if applicable. No additional management support is needed unless otherwise documented below in the visit note. 

## 2016-03-11 NOTE — Patient Instructions (Addendum)
Try taking vitamin D 2000 iu daily with a meal for bones  Avoid red meat/ fried foods/ egg yolks/ fatty breakfast meats/ butter, cheese and high fat dairy/ and shellfish

## 2016-03-13 NOTE — Assessment & Plan Note (Signed)
Reviewed health habits including diet and exercise and skin cancer prevention Reviewed appropriate screening tests for age  Also reviewed health mt list, fam hx and immunization status , as well as social and family history   See HPI AMW reviewed Labs reviewed Try taking vitamin D 2000 iu daily with a meal for bones  Avoid red meat/ fried foods/ egg yolks/ fatty breakfast meats/ butter, cheese and high fat dairy/ and shellfish

## 2016-03-13 NOTE — Assessment & Plan Note (Signed)
Stable dexa No fragility fractures Disc need for calcium/ vitamin D/ wt bearing exercise and bone density test every 2 y to monitor Disc safety/ fracture risk in detail

## 2016-03-13 NOTE — Assessment & Plan Note (Signed)
Disc goals for lipids and reasons to control them Rev labs with pt Rev low sat fat diet in detail Will cut back on cheese/high fat dairy

## 2016-05-30 ENCOUNTER — Encounter: Payer: Self-pay | Admitting: Family Medicine

## 2016-11-18 ENCOUNTER — Other Ambulatory Visit: Payer: Self-pay | Admitting: Family Medicine

## 2016-11-18 DIAGNOSIS — Z1231 Encounter for screening mammogram for malignant neoplasm of breast: Secondary | ICD-10-CM

## 2016-11-23 ENCOUNTER — Ambulatory Visit (INDEPENDENT_AMBULATORY_CARE_PROVIDER_SITE_OTHER): Payer: Medicare Other | Admitting: Family Medicine

## 2016-11-23 ENCOUNTER — Encounter: Payer: Self-pay | Admitting: Family Medicine

## 2016-11-23 VITALS — BP 148/88 | HR 70 | Temp 98.3°F | Wt 147.5 lb

## 2016-11-23 DIAGNOSIS — L570 Actinic keratosis: Secondary | ICD-10-CM

## 2016-11-23 NOTE — Patient Instructions (Signed)
Actinic Keratosis An actinic keratosis is a precancerous growth on the skin. This means that it could develop into skin cancer if it is not treated. About 1% of these growths (actinic keratoses) turn into skin cancer within one year if they are not treated. It is important to have all of these growths evaluated to determine the best treatment approach. What are the causes? This condition is caused by getting too much ultraviolet (UV) radiation from the sun or other UV light sources. What increases the risk? The following factors may make you more likely to develop this condition:  Having light-colored skin and blue eyes.  Having blonde or red hair.  Spending a lot of time in the sun.  Inadequate skin protection when outdoors. This may include:  Not using sunscreen properly.  Not covering up skin that is exposed to sunlight.  Aging. The risk of developing an actinic keratosis increases with age. What are the signs or symptoms? Actinic keratoses look like scaly, rough spots of skin.They can be as small as a pinhead or as big as a quarter. They may itch, hurt, or feel sensitive. In most cases, the growths become red. In some cases, they may be skin-colored, light tan, dark tan, pink, or a combination of any of these colors. There may be a small piece of pink or gray skin (skin tag) growing from the actinic keratosis. In some cases, it may be easier to notice actinic keratoses by feeling them, rather than seeing them. Actinic keratoses appear most often on areas of skin that get a lot of sun exposure, including the scalp, face, ears, lips, upper back, forearms, and the backs of the hands. Sometimes, actinic keratoses disappear, but many reappear a few days to a few weeks later. How is this diagnosed? This condition is usually diagnosed with a physical exam. A tissue sample may be removed from the actinic keratosis and examined under a microscope (biopsy). How is this treated?   Treatment for  this condition may include:  Scraping off the actinic keratosis (curettage).  Freezing the actinic keratosis with liquid nitrogen (cryosurgery). This causes the growth to eventually fall off the skin.  Applying medicated creams or gels to destroy the cells in the growth.  Applying chemicals to the actinic keratosis to make the outer layers of skin peel off (chemical peel).  Photodynamic therapy. In this procedure, medicated cream is applied to the actinic keratosis. This cream increases your skin's sensitivity to light. Then, a strong light is aimed at the actinic keratosis to destroy cells in the growth. Follow these instructions at home: Skin care   Apply cool, wet cloths (cool compresses) to the affected areas.  Do not scratch your skin.  Check your skin regularly for any growths, especially growths that:  Start to itch or bleed.  Change in size, shape, or color. Caring for the treated area   Keep the treated area clean and dry as told by your health care provider.  Do not apply any medicine, cream, or lotion to the treated area unless your health care provider tells you to do that.  Do not pick at blisters or try to break them open. This can cause infection and scarring.  If you have red or irritated skin after treatment, follow instructions from your health care provider about how to take care of the treated area. Make sure you:  Wash your hands with soap and water before you change your bandage (dressing). If soap and water are not available,   use hand sanitizer.  Change your dressing as told by your health care provider.  If you have red or irritated skin after treatment, check your treated area every day for signs of infection. Check for:  Swelling, pain, or more redness.  Fluid or blood.  Warmth.  Pus or a bad smell. General instructions   Take over-the-counter and prescription medicines only as told by your health care provider.  Return to your normal  activities as told by your health care provider. Ask your health care provider what activities are safe for you.  Do not use any tobacco products, such as cigarettes, chewing tobacco, and e-cigarettes. If you need help quitting, ask your health care provider.  Have a skin exam done every year by a health care provider who is a skin conditions specialist (dermatologist).  Keep all follow-up visits as told by your health care provider. This is important. How is this prevented?  Do not get sunburns.  Try to avoid the sun between 10:00 a.m. and 4:00 p.m. This is when the UV light is the strongest.  Use a sunscreen or sunblock with SPF 30 (sun protection factor 30) or greater.  Apply sunscreen before you are exposed to sunlight, and reapply periodically as often as directed by the instructions on the sunscreen container.  Always wear sunglasses that have UV protection, and always wear hats and clothing to protect your skin from sunlight.  When possible, avoid medicines that increase your sensitivity to sunlight. These include:  Certain antibiotic medicines.  Certain water pills (diuretics).  Certain prescription medicines that are used to treat acne (retinoids).  Do not use tanning beds or other indoor tanning devices. Contact a health care provider if:  You notice any changes or new growths on your skin.  You have swelling, pain, or more redness around your treated area.  You have fluid or blood coming from your treated area.  Your treated area feels warm to the touch.  You have pus or a bad smell coming from your treated area.  You have a fever.  You have a blister that becomes large and painful. This information is not intended to replace advice given to you by your health care provider. Make sure you discuss any questions you have with your health care provider. Document Released: 10/28/2008 Document Revised: 04/01/2016 Document Reviewed: 04/11/2015 Elsevier Interactive  Patient Education  2017 [employer] Inc[employer]  

## 2016-11-23 NOTE — Progress Notes (Signed)
Pre visit review using our clinic review tool, if applicable. No additional management support is needed unless otherwise documented below in the visit note. 

## 2016-11-23 NOTE — Progress Notes (Signed)
   Subjective:    Patient ID: Melanie Marshall, female    DOB: 03-20-1946, 71 y.o.   MRN: [id number]  HPI This is a 71 yo female who presents today with skin lesion on her back. She noticed it about 4-6 weeks ago. Felt it while bathing. Is not painful. 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 The school nurse recommended she have it checked out. She has had skin lesions requiring cryotherapy in the past.   Past Medical History:  Diagnosis Date  . Allergy    allergic rhinitis  . Arthritis   . Heart murmur    not treated  . History of blood transfusion 1970   after childbirth  . Hyperlipidemia   . Osteopenia    Past Surgical History:  Procedure Laterality Date  . TUBAL LIGATION  1979   Family History  Problem Relation Age of Onset  . Diabetes Mother   . Heart disease Mother     MI  . Hypertension Father     ??HTN  . Hyperlipidemia Father   . Cancer Father     pancreatic cancer  . Pancreatic cancer Father   . Hypertension Brother   . Cancer Brother     pancreastic (suspect)   . Cancer Paternal Grandmother     breast CA  . Colon cancer Neg Hx   . Stomach cancer Neg Hx    Social History  Substance Use Topics  . Smoking status: Former Smoker    Quit date: 08/16/1967  . Smokeless tobacco: Never Used  . Alcohol use 0.6 oz/week    1 Glasses of wine per week     Comment: occ      Review of Systems Per HPI    Objective:   Physical Exam  Constitutional: She appears well-developed and well-nourished. No distress.  HENT:  Head: Normocephalic and atraumatic.  Cardiovascular: Normal rate.   Pulmonary/Chest: Effort normal.  Skin: Lesion noted. She is not diaphoretic.     Vitals reviewed.     BP (!) 160/78 (BP Location: Right Arm, Patient Position: Sitting, Cuff Size: Normal)   Pulse 70   Temp 98.3 F (36.8 C) (Oral)   Wt 147 lb 8 oz (66.9 kg)   SpO2 98%   BMI 29.29 kg/m  Wt Readings from Last 3 Encounters:  11/23/16  147 lb 8 oz (66.9 kg)  03/11/16 141 lb 8 oz (64.2 kg)  03/10/16 140 lb 4 oz (63.6 kg)   BP Readings from Last 3 Encounters:  11/23/16 (!) 160/78  03/11/16 140/68  03/10/16 120/78   Recheck BP- 148/88  Verbal consent obtained.  Cleansed skin with alcohol prep pad.  Cryotherapy applied x 3 (freeze-thaw-freeze).  The patient tolerated the procedure well.     Assessment & Plan:  1. Actinic keratosis - Provided written and verbal information regarding diagnosis and treatment. - RTC precautions reviewed - Follow up as scheduled with Dr. Glori Bickers for annual exam   Clarene Reamer, FNP-BC  [facility] [place] [place] [facility]  11/23/2016 3:57 PM

## 2016-12-14 ENCOUNTER — Ambulatory Visit
Admission: RE | Admit: 2016-12-14 | Discharge: 2016-12-14 | Disposition: A | Discharge status: Home / Self Care | Payer: Medicare Other | Source: Ambulatory Visit | Attending: Family Medicine | Admitting: Family Medicine

## 2016-12-14 DIAGNOSIS — Z1231 Encounter for screening mammogram for malignant neoplasm of breast: Secondary | ICD-10-CM

## 2017-03-11 ENCOUNTER — Telehealth: Payer: Self-pay | Admitting: Family Medicine

## 2017-03-11 DIAGNOSIS — Z0000 Encounter for general adult medical examination without abnormal findings: Secondary | ICD-10-CM

## 2017-03-11 NOTE — Telephone Encounter (Signed)
-----   Message from Ellamae Sia sent at 03/01/2017  4:09 PM EDT ----- Regarding: Lab orders for Monday, 7.30.18 Patient is scheduled for CPX labs, please order future labs, Thanks , Karna Christmas

## 2017-03-13 ENCOUNTER — Other Ambulatory Visit (INDEPENDENT_AMBULATORY_CARE_PROVIDER_SITE_OTHER): Payer: Medicare Other

## 2017-03-13 ENCOUNTER — Encounter: Payer: Medicare Other | Admitting: Family Medicine

## 2017-03-13 DIAGNOSIS — Z0000 Encounter for general adult medical examination without abnormal findings: Secondary | ICD-10-CM

## 2017-03-13 LAB — COMPREHENSIVE METABOLIC PANEL
ALBUMIN: 4.3 g/dL (ref 3.5–5.2)
ALT: 11 U/L (ref 0–35)
AST: 16 U/L (ref 0–37)
Alkaline Phosphatase: 38 U/L — ABNORMAL LOW (ref 39–117)
BUN: 22 mg/dL (ref 6–23)
CHLORIDE: 105 meq/L (ref 96–112)
CO2: 26 mEq/L (ref 19–32)
Calcium: 9.4 mg/dL (ref 8.4–10.5)
Creatinine, Ser: 0.91 mg/dL (ref 0.40–1.20)
GFR: 64.84 mL/min (ref >60.00)
Glucose, Bld: 97 mg/dL (ref 70–99)
POTASSIUM: 4.5 meq/L (ref 3.5–5.1)
Sodium: 139 mEq/L (ref 135–145)
Total Bilirubin: 0.5 mg/dL (ref 0.2–1.2)
Total Protein: 7.1 g/dL (ref 6.0–8.3)

## 2017-03-13 LAB — CBC WITH DIFFERENTIAL/PLATELET
BASOS ABS: 0 10*3/uL (ref 0.0–0.1)
BASOS PCT: 0.6 % (ref 0.0–3.0)
Eosinophils Absolute: 0.1 10*3/uL (ref 0.0–0.7)
Eosinophils Relative: 2.1 % (ref 0.0–5.0)
HCT: 39.1 % (ref 36.0–46.0)
Hemoglobin: 13 g/dL (ref 12.0–15.0)
Lymphocytes Relative: 47.6 % — ABNORMAL HIGH (ref 12.0–46.0)
Lymphs Abs: 1.8 10*3/uL (ref 0.7–4.0)
MCHC: 33.2 g/dL (ref 30.0–36.0)
MCV: 91.4 fl (ref 78.0–100.0)
Monocytes Absolute: 0.4 10*3/uL (ref 0.1–1.0)
Monocytes Relative: 10 % (ref 3.0–12.0)
NEUTROS ABS: 1.5 10*3/uL (ref 1.4–7.7)
Neutrophils Relative %: 39.7 % — ABNORMAL LOW (ref 43.0–77.0)
PLATELETS: 320 10*3/uL (ref 150.0–400.0)
RBC: 4.28 Mil/uL (ref 3.87–5.11)
RDW: 14 % (ref 11.5–15.5)
WBC: 3.8 10*3/uL — ABNORMAL LOW (ref 4.0–10.5)

## 2017-03-13 LAB — LIPID PANEL
CHOL/HDL RATIO: 5
CHOLESTEROL: 212 mg/dL — AB (ref 0–200)
HDL: 46.4 mg/dL (ref >39.00)
LDL Cholesterol: 131 mg/dL — ABNORMAL HIGH (ref 0–99)
NonHDL: 165.72
Triglycerides: 176 mg/dL — ABNORMAL HIGH (ref 0.0–149.0)
VLDL: 35.2 mg/dL (ref 0.0–40.0)

## 2017-03-13 LAB — TSH: TSH: 1.62 u[IU]/mL (ref 0.35–4.50)

## 2017-03-14 ENCOUNTER — Ambulatory Visit (INDEPENDENT_AMBULATORY_CARE_PROVIDER_SITE_OTHER): Payer: Medicare Other | Admitting: Family Medicine

## 2017-03-14 ENCOUNTER — Encounter: Payer: Self-pay | Admitting: Family Medicine

## 2017-03-14 VITALS — BP 130/70 | HR 63 | Temp 98.3°F | Ht 59.25 in | Wt 146.0 lb

## 2017-03-14 DIAGNOSIS — E7800 Pure hypercholesterolemia, unspecified: Secondary | ICD-10-CM | POA: Diagnosis not present

## 2017-03-14 DIAGNOSIS — M899 Disorder of bone, unspecified: Secondary | ICD-10-CM

## 2017-03-14 DIAGNOSIS — Z0000 Encounter for general adult medical examination without abnormal findings: Secondary | ICD-10-CM | POA: Diagnosis not present

## 2017-03-14 DIAGNOSIS — M949 Disorder of cartilage, unspecified: Secondary | ICD-10-CM | POA: Diagnosis not present

## 2017-03-14 DIAGNOSIS — R030 Elevated blood-pressure reading, without diagnosis of hypertension: Secondary | ICD-10-CM

## 2017-03-14 DIAGNOSIS — D709 Neutropenia, unspecified: Secondary | ICD-10-CM | POA: Diagnosis not present

## 2017-03-14 DIAGNOSIS — I10 Essential (primary) hypertension: Secondary | ICD-10-CM | POA: Insufficient documentation

## 2017-03-14 DIAGNOSIS — E2839 Other primary ovarian failure: Secondary | ICD-10-CM | POA: Diagnosis not present

## 2017-03-14 NOTE — Assessment & Plan Note (Signed)
Disc goals for lipids and reasons to control them Rev labs with pt Rev low sat fat diet in detail LDL 131 - pref below 100 Given handout on eating/will work on this  May need to consider statin if not imp in the future

## 2017-03-14 NOTE — Patient Instructions (Addendum)
We will refer you for a bone density test   For cholesterol Avoid red meat/ fried foods/ egg yolks/ fatty breakfast meats/ butter, cheese and high fat dairy/ and shellfish   For blood pressure - look at the [facility] eating plan    Keep exercising  Take care of yourself

## 2017-03-14 NOTE — Assessment & Plan Note (Signed)
Baseline mildly low wbc Today 3.8 No symptoms  Continue to watch

## 2017-03-14 NOTE — Assessment & Plan Note (Signed)
Reviewed health habits including diet and exercise and skin cancer prevention Reviewed appropriate screening tests for age  Also reviewed health mt list, fam hx and immunization status , as well as social and family history   See HPI Labs reviewed  Had adv directive  No memory problems or falls  Cholesterol mildly high- disc lifestyle change bp trending up - watching closely  Ref for dexa  utd imms

## 2017-03-14 NOTE — Assessment & Plan Note (Signed)
Improved on 2nd check  BP: 130/70  Watch closely for trend  May develop ess HTN -will tx if needed Info given on dash eating plan  Continue exercise

## 2017-03-14 NOTE — Progress Notes (Signed)
Subjective:    Patient ID: Melanie Marshall, female    DOB: 03-05-1946, 71 y.o.   MRN: [id number]  HPI Here for health maintenance exam and to review chronic medical problems    Has not had amw visit   Wt Readings from Last 3 Encounters:  03/14/17 146 lb (66.2 kg)  11/23/16 147 lb 8 oz (66.9 kg)  03/11/16 141 lb 8 oz (64.2 kg)  down a lb since April  Goes to the gym- was there this am  29.24 kg/m  dexa 7/16 osteopenia No falls  No fracture  She wants to set that up Taking ca and D  Has her advance directive  No problems with memory   Mammogram 5/18 nl  Self breast exam-no lumps or changes   Colonoscopy 3/13 nl with 10 year recall  No problems   utd imms zostavax 11/13  Hyperlipidemia  Lab Results  Component Value Date   CHOL 212 (H) 03/13/2017   CHOL 218 (H) 03/10/2016   CHOL 228 (H) 03/02/2015   Lab Results  Component Value Date   HDL 46.40 03/13/2017   HDL 48.70 03/10/2016   HDL 52.10 03/02/2015   Lab Results  Component Value Date   LDLCALC 131 (H) 03/13/2017   LDLCALC 131 (H) 03/10/2016   LDLCALC 152 (H) 03/02/2015   Lab Results  Component Value Date   TRIG 176.0 (H) 03/13/2017   TRIG 192.0 (H) 03/10/2016   TRIG 118.0 03/02/2015   Lab Results  Component Value Date   CHOLHDL 5 03/13/2017   CHOLHDL 4 03/10/2016   CHOLHDL 4 03/02/2015   Lab Results  Component Value Date   LDLDIRECT 138.8 02/21/2013   LDLDIRECT 193.6 11/15/2012   LDLDIRECT 143.8 09/21/2011   LDL 130s  Her diet has not been as good this summer  Dating and eating out more  Very little red meat  Avoids fried foods /not often  Cheese is her downfall    Past hx of neutropenia Lab Results  Component Value Date   WBC 3.8 (L) 03/13/2017   HGB 13.0 03/13/2017   HCT 39.1 03/13/2017   MCV 91.4 03/13/2017   PLT 320.0 03/13/2017    Stable / no symptoms     Chemistry      Component Value Date/Time   NA 139 03/13/2017 0829   K 4.5 03/13/2017 0829   CL 105 03/13/2017  0829   CO2 26 03/13/2017 0829   BUN 22 03/13/2017 0829   CREATININE 0.91 03/13/2017 0829      Component Value Date/Time   CALCIUM 9.4 03/13/2017 0829   ALKPHOS 38 (L) 03/13/2017 0829   AST 16 03/13/2017 0829   ALT 11 03/13/2017 0829   BILITOT 0.5 03/13/2017 0829      BP Readings from Last 3 Encounters:  03/14/17 140/72  11/23/16 (!) 148/88  03/11/16 140/68   Blood pressure is creeping up  Better on 2nd check   BP: 130/70  Watching this  Does eat processed foods   Lab Results  Component Value Date   TSH 1.62 03/13/2017     Patient Active Problem List   Diagnosis Date Noted  . Elevated blood pressure reading 03/14/2017  . Estrogen deficiency 03/09/2015  . Routine general medical examination at a health care facility 03/01/2015  . Encounter for Medicare annual wellness exam 11/14/2012  . Special screening for malignant neoplasms, colon 09/23/2011  . NEUTROPENIA UNSPECIFIED 07/23/2010  . Hyperlipidemia 06/24/2008  . ALLERGIC RHINITIS 04/17/2007  . Disorder of  bone and cartilage 04/17/2007  . MURMUR 04/17/2007  . MIGRAINES, HX OF 04/17/2007   Past Medical History:  Diagnosis Date  . Allergy    allergic rhinitis  . Arthritis   . Heart murmur    not treated  . History of blood transfusion 1970   after childbirth  . Hyperlipidemia   . Osteopenia    Past Surgical History:  Procedure Laterality Date  . TUBAL LIGATION  1979   Social History  Substance Use Topics  . Smoking status: Former Smoker    Quit date: 08/16/1967  . Smokeless tobacco: Never Used  . Alcohol use 0.6 oz/week    1 Glasses of wine per week     Comment: occ   Family History  Problem Relation Age of Onset  . Diabetes Mother   . Heart disease Mother        MI  . Hypertension Father        ??HTN  . Hyperlipidemia Father   . Cancer Father        pancreatic cancer  . Pancreatic cancer Father   . Hypertension Brother   . Cancer Brother        pancreastic (suspect)   . Cancer Paternal  Grandmother        breast CA  . Breast cancer Maternal Grandmother   . Colon cancer Neg Hx   . Stomach cancer Neg Hx    No Known Allergies Current Outpatient Prescriptions on File Prior to Visit  Medication Sig Dispense Refill  . ibuprofen (ADVIL,MOTRIN) 200 MG tablet Take 200 mg by mouth every 6 (six) hours as needed.    . Multiple Vitamin (MULTIVITAMIN) capsule Take 1 capsule by mouth daily.     No current facility-administered medications on file prior to visit.      Review of Systems Review of Systems  Constitutional: Negative for fever, appetite change, fatigue and unexpected weight change.  Eyes: Negative for pain and visual disturbance.  Respiratory: Negative for cough and shortness of breath.   Cardiovascular: Negative for cp or palpitations    Gastrointestinal: Negative for nausea, diarrhea and constipation.  Genitourinary: Negative for urgency and frequency. pos for vaginal dryness Skin: Negative for pallor or rash   MSK pos for worsening OA pain in hands  Neurological: Negative for weakness, light-headedness, numbness and headaches.  Hematological: Negative for adenopathy. Does not bruise/bleed easily.  Psychiatric/Behavioral: Negative for dysphoric mood. The patient is not nervous/anxious.         Objective:   Physical Exam  Constitutional: She appears well-developed and well-nourished. No distress.  overwt and well app  HENT:  Head: Normocephalic and atraumatic.  Right Ear: External ear normal.  Left Ear: External ear normal.  Mouth/Throat: Oropharynx is clear and moist.  Eyes: Pupils are equal, round, and reactive to light. Conjunctivae and EOM are normal. No scleral icterus.  Neck: Normal range of motion. Neck supple. No JVD present. Carotid bruit is not present. No thyromegaly present.  Cardiovascular: Normal rate, regular rhythm and intact distal pulses.  Exam reveals no gallop.   Pulmonary/Chest: Effort normal and breath sounds normal. No respiratory  distress. She has no wheezes. She exhibits no tenderness.  Abdominal: Soft. Bowel sounds are normal. She exhibits no distension, no abdominal bruit and no mass. There is no tenderness.  Genitourinary: No breast swelling, tenderness, discharge or bleeding.  Genitourinary Comments: Breast exam: No mass, nodules, thickening, tenderness, bulging, retraction, inflamation, nipple discharge or skin changes noted.  No axillary or clavicular  LA.      Musculoskeletal: Normal range of motion. She exhibits no edema or tenderness.  No kyphosis   Lymphadenopathy:    She has no cervical adenopathy.  Neurological: She is alert. She has normal reflexes. No cranial nerve deficit. She exhibits normal muscle tone. Coordination normal.  Skin: Skin is warm and dry. No rash noted. No erythema. No pallor.  Lentigines and some sks diffusely  Psychiatric: She has a normal mood and affect.          Assessment & Plan:   Problem List Items Addressed This Visit      Musculoskeletal and Integument   Disorder of bone and cartilage    Due for 2 y dexa On ca and D Exercises  No falls or fx         Other   Elevated blood pressure reading    Improved on 2nd check  BP: 130/70  Watch closely for trend  May develop ess HTN -will tx if needed Info given on dash eating plan  Continue exercise       Estrogen deficiency   Relevant Orders   DG Bone Density   Hyperlipidemia    Disc goals for lipids and reasons to control them Rev labs with pt Rev low sat fat diet in detail LDL 131 - pref below 100 Given handout on eating/will work on this  May need to consider statin if not imp in the future       NEUTROPENIA UNSPECIFIED    Baseline mildly low wbc Today 3.8 No symptoms  Continue to watch       Routine general medical examination at a health care facility - Primary    Reviewed health habits including diet and exercise and skin cancer prevention Reviewed appropriate screening tests for age  Also  reviewed health mt list, fam hx and immunization status , as well as social and family history   See HPI Labs reviewed  Had adv directive  No memory problems or falls  Cholesterol mildly high- disc lifestyle change bp trending up - watching closely  Ref for dexa  utd imms

## 2017-03-14 NOTE — Assessment & Plan Note (Signed)
Due for 2 y dexa On ca and D Exercises  No falls or fx

## 2017-03-22 ENCOUNTER — Ambulatory Visit
Admission: RE | Admit: 2017-03-22 | Discharge: 2017-03-22 | Disposition: A | Discharge status: Home / Self Care | Payer: Medicare Other | Source: Ambulatory Visit | Attending: Family Medicine | Admitting: Family Medicine

## 2017-03-22 DIAGNOSIS — E2839 Other primary ovarian failure: Secondary | ICD-10-CM

## 2017-10-17 ENCOUNTER — Ambulatory Visit: Payer: Medicare Other | Admitting: Internal Medicine

## 2017-10-17 ENCOUNTER — Encounter: Payer: Self-pay | Admitting: Internal Medicine

## 2017-10-17 ENCOUNTER — Telehealth: Payer: Self-pay | Admitting: Family Medicine

## 2017-10-17 VITALS — BP 122/78 | HR 69 | Temp 98.3°F | Wt 149.0 lb

## 2017-10-17 DIAGNOSIS — N3000 Acute cystitis without hematuria: Secondary | ICD-10-CM | POA: Diagnosis not present

## 2017-10-17 DIAGNOSIS — R300 Dysuria: Secondary | ICD-10-CM

## 2017-10-17 DIAGNOSIS — R350 Frequency of micturition: Secondary | ICD-10-CM

## 2017-10-17 LAB — POC URINALSYSI DIPSTICK (AUTOMATED)
Bilirubin, UA: NEGATIVE
Blood, UA: NEGATIVE
Glucose, UA: NEGATIVE
Ketones, UA: NEGATIVE
NITRITE UA: NEGATIVE
PROTEIN UA: NEGATIVE
Spec Grav, UA: 1.015 (ref 1.010–1.025)
Urobilinogen, UA: 0.2 E.U./dL
pH, UA: 6 (ref 5.0–8.0)

## 2017-10-17 MED ORDER — NITROFURANTOIN MONOHYD MACRO 100 MG PO CAPS: 100.0000 mg | ORAL_CAPSULE | Freq: Two times a day (BID) | ORAL | 0 refills | Status: DC

## 2017-10-17 NOTE — Addendum Note (Signed)
Addended by: Lurlean Nanny on: 10/17/2017 04:06 PM   Modules accepted: Orders

## 2017-10-17 NOTE — Patient Instructions (Signed)

## 2017-10-17 NOTE — Telephone Encounter (Signed)
Pt dropped off form for [place] trip. Placed in rx tower.

## 2017-10-17 NOTE — Progress Notes (Signed)
HPI  Pt presents to the clinic today with c/o urinary frequency and dysuria. She reports this started 1 week ago. She denies urgency or blood in her urine. She denies fever, chills, nausea or low back pain. She has treid AZO with some relief. She reports she was treated for a UTI 2/17 with Keflex. She did have complete resolution of those symptoms, but she reports this feels the same.   Review of Systems  Past Medical History:  Diagnosis Date  . Allergy    allergic rhinitis  . Arthritis   . Heart murmur    not treated  . History of blood transfusion 1970   after childbirth  . Hyperlipidemia   . Osteopenia     Family History  Problem Relation Age of Onset  . Diabetes Mother   . Heart disease Mother        MI  . Hypertension Father        ??HTN  . Hyperlipidemia Father   . Cancer Father        pancreatic cancer  . Pancreatic cancer Father   . Hypertension Brother   . Cancer Brother        pancreastic (suspect)   . Cancer Paternal Grandmother        breast CA  . Breast cancer Maternal Grandmother   . Colon cancer Neg Hx   . Stomach cancer Neg Hx     Social History   Socioeconomic History  . Marital status: Married    Spouse name: Not on file  . Number of children: Not on file  . Years of education: Not on file  . Highest education level: Not on file  Social Needs  . Financial resource strain: Not on file  . Food insecurity - worry: Not on file  . Food insecurity - inability: Not on file  . Transportation needs - medical: Not on file  . Transportation needs - non-medical: Not on file  Occupational History  . Not on file  Tobacco Use  . Smoking status: Former Smoker    Last attempt to quit: 08/16/1967    Years since quitting: 50.2  . Smokeless tobacco: Never Used  Substance and Sexual Activity  . Alcohol use: Yes    Alcohol/week: 0.6 oz    Types: 1 Glasses of wine per week    Comment: occ  . Drug use: No  . Sexual activity: No  Other Topics Concern  .  Not on file  Social History Narrative  . Not on file    No Known Allergies   Constitutional: Denies fever, malaise, fatigue, headache or abrupt weight changes.   GU: Pt reports frequency and pain with urination. Denies burning sensation, blood in urine, odor or discharge. Skin: Denies redness, rashes, lesions or ulcercations.   No other specific complaints in a complete review of systems (except as listed in HPI above).    Objective:   Physical Exam  BP 122/78   Pulse 69   Temp 98.3 F (36.8 C) (Oral)   Wt 149 lb (67.6 kg)   SpO2 99%   BMI 29.84 kg/m   Wt Readings from Last 3 Encounters:  10/17/17 149 lb (67.6 kg)  03/14/17 146 lb (66.2 kg)  11/23/16 147 lb 8 oz (66.9 kg)    General: Appears her stated age, well developed, well nourished in NAD. Cardiovascular: Normal rate and rhythm. S1,S2 noted.   Pulmonary/Chest: Normal effort and positive vesicular breath sounds. No respiratory distress. No wheezes, rales or  ronchi noted.  Abdomen: Soft. Normal bowel sounds. No distention or masses noted.  Tender to palpation over the bladder area. No CVA tenderness.       Assessment & Plan:    Frequency, Dysuria secondary to UTI:  Urinalysis: 2+ leuks Will send urine culture eRx sent if for Macrobid 100 mg BID x 5 days OK to take AZO OTC Drink plenty of fluids  RTC as needed or if symptoms persist. Webb Silversmith, NP

## 2017-10-18 LAB — URINE CULTURE
MICRO NUMBER: 90281760
SPECIMEN QUALITY: ADEQUATE

## 2017-10-18 NOTE — Telephone Encounter (Signed)
In your inbox.

## 2017-10-19 NOTE — Telephone Encounter (Signed)
Done and in IN box 

## 2017-10-20 NOTE — Telephone Encounter (Signed)
Pt notified form ready for pick up 

## 2017-12-04 ENCOUNTER — Other Ambulatory Visit: Payer: Self-pay | Admitting: Family Medicine

## 2017-12-04 DIAGNOSIS — Z139 Encounter for screening, unspecified: Secondary | ICD-10-CM

## 2018-01-01 ENCOUNTER — Ambulatory Visit
Admission: RE | Admit: 2018-01-01 | Discharge: 2018-01-01 | Disposition: A | Discharge status: Home / Self Care | Payer: Medicare Other | Source: Ambulatory Visit | Attending: Family Medicine | Admitting: Family Medicine

## 2018-01-01 DIAGNOSIS — Z139 Encounter for screening, unspecified: Secondary | ICD-10-CM

## 2018-03-26 ENCOUNTER — Ambulatory Visit: Payer: Medicare Other

## 2018-03-26 ENCOUNTER — Ambulatory Visit (INDEPENDENT_AMBULATORY_CARE_PROVIDER_SITE_OTHER): Payer: Medicare Other

## 2018-03-26 VITALS — BP 128/82 | HR 68 | Temp 98.0°F | Ht 59.5 in | Wt 145.0 lb

## 2018-03-26 DIAGNOSIS — Z0000 Encounter for general adult medical examination without abnormal findings: Secondary | ICD-10-CM

## 2018-03-26 DIAGNOSIS — E785 Hyperlipidemia, unspecified: Secondary | ICD-10-CM

## 2018-03-26 LAB — COMPREHENSIVE METABOLIC PANEL
ALBUMIN: 4.5 g/dL (ref 3.5–5.2)
ALK PHOS: 41 U/L (ref 39–117)
ALT: 13 U/L (ref 0–35)
AST: 19 U/L (ref 0–37)
BUN: 19 mg/dL (ref 6–23)
CO2: 27 mEq/L (ref 19–32)
Calcium: 9.7 mg/dL (ref 8.4–10.5)
Chloride: 104 mEq/L (ref 96–112)
Creatinine, Ser: 0.95 mg/dL (ref 0.40–1.20)
GFR: 61.52 mL/min (ref >60.00)
Glucose, Bld: 98 mg/dL (ref 70–99)
Potassium: 4.5 mEq/L (ref 3.5–5.1)
SODIUM: 139 meq/L (ref 135–145)
TOTAL PROTEIN: 7.6 g/dL (ref 6.0–8.3)
Total Bilirubin: 0.7 mg/dL (ref 0.2–1.2)

## 2018-03-26 LAB — CBC WITH DIFFERENTIAL/PLATELET
Basophils Absolute: 0 10*3/uL (ref 0.0–0.1)
Basophils Relative: 0.6 % (ref 0.0–3.0)
EOS PCT: 1.9 % (ref 0.0–5.0)
Eosinophils Absolute: 0.1 10*3/uL (ref 0.0–0.7)
HCT: 39.7 % (ref 36.0–46.0)
Hemoglobin: 13.4 g/dL (ref 12.0–15.0)
LYMPHS ABS: 1.7 10*3/uL (ref 0.7–4.0)
Lymphocytes Relative: 45 % (ref 12.0–46.0)
MCHC: 33.6 g/dL (ref 30.0–36.0)
MCV: 90.1 fl (ref 78.0–100.0)
MONO ABS: 0.4 10*3/uL (ref 0.1–1.0)
Monocytes Relative: 9.6 % (ref 3.0–12.0)
Neutro Abs: 1.6 10*3/uL (ref 1.4–7.7)
Neutrophils Relative %: 42.9 % — ABNORMAL LOW (ref 43.0–77.0)
Platelets: 325 10*3/uL (ref 150.0–400.0)
RBC: 4.41 Mil/uL (ref 3.87–5.11)
RDW: 13.7 % (ref 11.5–15.5)
WBC: 3.8 10*3/uL — ABNORMAL LOW (ref 4.0–10.5)

## 2018-03-26 LAB — LIPID PANEL
Cholesterol: 223 mg/dL — ABNORMAL HIGH (ref 0–200)
HDL: 52.9 mg/dL (ref >39.00)
LDL Cholesterol: 140 mg/dL — ABNORMAL HIGH (ref 0–99)
NonHDL: 170.04
Total CHOL/HDL Ratio: 4
Triglycerides: 149 mg/dL (ref 0.0–149.0)
VLDL: 29.8 mg/dL (ref 0.0–40.0)

## 2018-03-26 LAB — TSH: TSH: 1.36 u[IU]/mL (ref 0.35–4.50)

## 2018-03-26 NOTE — Patient Instructions (Signed)
Melanie Marshall , Thank you for taking time to come for your Medicare Wellness Visit. I appreciate your ongoing commitment to your health goals. Please review the following plan we discussed and let me know if I can assist you in the future.   These are the goals we discussed: Goals    . Increase physical activity     Starting 03/26/2018, I will continue to exercise for at least 60 min 2-4 days per week.        This is a list of the screening recommended for you and due dates:  Health Maintenance  Topic Date Due  . Flu Shot  11/13/2018*  .  Hepatitis C: One time screening is recommended by Center for Disease Control  (CDC) for  adults born from [birth year] through 1965.   09/17/2023*  . Mammogram  01/02/2019  . Colon Cancer Screening  10/30/2021  . Tetanus Vaccine  11/01/2027  . DEXA scan (bone density measurement)  Completed  . Pneumonia vaccines  Completed  *Topic was postponed. The date shown is not the original due date.   Preventive Care for Adults  A healthy lifestyle and preventive care can promote health and wellness. Preventive health guidelines for adults include the following key practices.  . A routine yearly physical is a good way to check with your health care provider about your health and preventive screening. It is a chance to share any concerns and updates on your health and to receive a thorough exam.  . Visit your dentist for a routine exam and preventive care every 6 months. Brush your teeth twice a day and floss once a day. Good oral hygiene prevents tooth decay and gum disease.  . The frequency of eye exams is based on your age, health, family medical history, use  of contact lenses, and other factors. Follow your health care provider's recommendations for frequency of eye exams.  . Eat a healthy diet. Foods like vegetables, fruits, whole grains, low-fat dairy products, and lean protein foods contain the nutrients you need without too many calories. Decrease your intake of  foods high in solid fats, added sugars, and salt. Eat the right amount of calories for you. Get information about a proper diet from your health care provider, if necessary.  . Regular physical exercise is one of the most important things you can do for your health. Most adults should get at least 150 minutes of moderate-intensity exercise (any activity that increases your heart rate and causes you to sweat) each week. In addition, most adults need muscle-strengthening exercises on 2 or more days a week.  Silver Sneakers may be a benefit available to you. To determine eligibility, you may visit the website: www.silversneakers.com or contact program at 724-334-8431 Mon-Fri between 8AM-8PM.   . Maintain a healthy weight. The body mass index (BMI) is a screening tool to identify possible weight problems. It provides an estimate of body fat based on height and weight. Your health care provider can find your BMI and can help you achieve or maintain a healthy weight.   For adults 20 years and older: ? A BMI below 18.5 is considered underweight. ? A BMI of 18.5 to 24.9 is normal. ? A BMI of 25 to 29.9 is considered overweight. ? A BMI of 30 and above is considered obese.   . Maintain normal blood lipids and cholesterol levels by exercising and minimizing your intake of saturated fat. Eat a balanced diet with plenty of fruit and vegetables. Blood tests  for lipids and cholesterol should begin at age 75 and be repeated every 5 years. If your lipid or cholesterol levels are high, you are over 50, or you are at high risk for heart disease, you may need your cholesterol levels checked more frequently. Ongoing high lipid and cholesterol levels should be treated with medicines if diet and exercise are not working.  . If you smoke, find out from your health care provider how to quit. If you do not use tobacco, please do not start.  . If you choose to drink alcohol, please do not consume more than 2 drinks per  day. One drink is considered to be 12 ounces (355 mL) of beer, 5 ounces (148 mL) of wine, or 1.5 ounces (44 mL) of liquor.  . If you are 27-81 years old, ask your health care provider if you should take aspirin to prevent strokes.  . Use sunscreen. Apply sunscreen liberally and repeatedly throughout the day. You should seek shade when your shadow is shorter than you. Protect yourself by wearing long sleeves, pants, a wide-brimmed hat, and sunglasses year round, whenever you are outdoors.  . Once a month, do a whole body skin exam, using a mirror to look at the skin on your back. Tell your health care provider of new moles, moles that have irregular borders, moles that are larger than a pencil eraser, or moles that have changed in shape or color.

## 2018-03-26 NOTE — Progress Notes (Signed)
Subjective:   Melanie Marshall is a 72 y.o. female who presents for Medicare Annual (Subsequent) preventive examination.  Review of Systems:  N/A Cardiac Risk Factors include: advanced age (>66men, >58 women);dyslipidemia     Objective:     Vitals: BP 128/82 (BP Location: Right Arm, Patient Position: Sitting, Cuff Size: Normal)   Pulse 68   Temp 98 F (36.7 C) (Oral)   Ht 4' 11.5" (1.511 m) Comment: no shoes  Wt 145 lb (65.8 kg)   SpO2 99%   BMI 28.80 kg/m   Body mass index is 28.8 kg/m.  Advanced Directives 03/26/2018 03/10/2016  Does Patient Have a Medical Advance Directive? Yes Yes  Type of Paramedic of [place];Living will Living will;Healthcare Power of Attorney  Does patient want to make changes to medical advance directive? - No - Patient declined  Copy of [place] in Chart? Yes Yes    Tobacco Social History   Tobacco Use  Smoking Status Former Smoker  . Last attempt to quit: 08/16/1967  . Years since quitting: 50.6  Smokeless Tobacco Never Used     Counseling given: No   Clinical Intake:  Pre-visit preparation completed: Yes  Pain : No/denies pain Pain Score: 0-No pain     Nutritional Status: BMI 25 -29 Overweight Nutritional Risks: None Diabetes: No  How often do you need to have someone help you when you read instructions, pamphlets, or other written materials from your doctor or pharmacy?: 1 - Never What is the last grade level you completed in school?: 12th grade + some college  Interpreter Needed?: No  Comments: pt is a widow and lives alone Information entered by :: LPinson, LPN  Past Medical History:  Diagnosis Date  . Allergy    allergic rhinitis  . Arthritis   . Heart murmur    not treated  . History of blood transfusion 1970   after childbirth  . Hyperlipidemia   . Osteopenia    Past Surgical History:  Procedure Laterality Date  . TUBAL LIGATION  1979   Family History  Problem  Relation Age of Onset  . Diabetes Mother   . Heart disease Mother        MI  . Hypertension Father        ??HTN  . Hyperlipidemia Father   . Cancer Father        pancreatic cancer  . Pancreatic cancer Father   . Hypertension Brother   . Cancer Brother        pancreastic (suspect)   . Cancer Paternal Grandmother        breast CA  . Breast cancer Maternal Grandmother   . Colon cancer Neg Hx   . Stomach cancer Neg Hx    Social History   Socioeconomic History  . Marital status: Married    Spouse name: Not on file  . Number of children: Not on file  . Years of education: Not on file  . Highest education level: Not on file  Occupational History  . Not on file  Social Needs  . Financial resource strain: Not on file  . Food insecurity:    Worry: Not on file    Inability: Not on file  . Transportation needs:    Medical: Not on file    Non-medical: Not on file  Tobacco Use  . Smoking status: Former Smoker    Last attempt to quit: 08/16/1967    Years since quitting: 50.6  .  Smokeless tobacco: Never Used  Substance and Sexual Activity  . Alcohol use: Yes    Alcohol/week: 1.0 standard drinks    Types: 1 Glasses of wine per week    Comment: occ  . Drug use: No  . Sexual activity: Never  Lifestyle  . Physical activity:    Days per week: Not on file    Minutes per session: Not on file  . Stress: Not on file  Relationships  . Social connections:    Talks on phone: Not on file    Gets together: Not on file    Attends religious service: Not on file    Active member of club or organization: Not on file    Attends meetings of clubs or organizations: Not on file    Relationship status: Not on file  Other Topics Concern  . Not on file  Social History Narrative  . Not on file    Outpatient Encounter Medications as of 03/26/2018  Medication Sig  . Cholecalciferol (VITAMIN D3) 2000 units TABS Take 1 tablet by mouth daily.  [name] Kitchen ibuprofen (ADVIL,MOTRIN) 200 MG tablet Take 200 mg  by mouth every 6 (six) hours as needed.  . Multiple Vitamin (MULTIVITAMIN) capsule Take 1 capsule by mouth daily.  . Multiple Vitamins-Minerals (PRESERVISION AREDS PO) Take 1 tablet by mouth 2 (two) times daily.  . [DISCONTINUED] nitrofurantoin, macrocrystal-monohydrate, (MACROBID) 100 MG capsule Take 1 capsule (100 mg total) by mouth 2 (two) times daily.   No facility-administered encounter medications on file as of 03/26/2018.     Activities of Daily Living In your present state of health, do you have any difficulty performing the following activities: 03/26/2018  Hearing? N  Vision? N  Difficulty concentrating or making decisions? N  Walking or climbing stairs? N  Dressing or bathing? N  Doing errands, shopping? N  Preparing Food and eating ? N  Using the Toilet? N  In the past six months, have you accidently leaked urine? N  Do you have problems with loss of bowel control? N  Managing your Medications? N  Managing your Finances? N  Housekeeping or managing your Housekeeping? N  Some recent data might be hidden    Patient Care Team: Tower, Wynelle Fanny, MD as PCP - General Conley Simmonds, DDS as Referring Physician (Dentistry) Monna Fam, MD as Consulting Physician (Ophthalmology)    Assessment:   This is a routine wellness examination for [place].   Hearing Screening   125Hz  250Hz  500Hz  1000Hz  2000Hz  3000Hz  4000Hz  6000Hz  8000Hz   Right ear:   40 40 40  40    Left ear:   40 40 40  40    Vision Screening Comments: Vision exam in Nov 2018    Exercise Activities and Dietary recommendations Current Exercise Habits: Structured exercise class, Type of exercise: Other - see comments(Silver Sneakers ), Time (Minutes): 60, Frequency (Times/Week): 4, Weekly Exercise (Minutes/Week): 240, Intensity: Moderate, Exercise limited by: None identified  Goals    . Increase physical activity     Starting 03/26/2018, I will continue to exercise for at least 60 min 2-4 days per week.         Fall Risk Fall Risk  03/26/2018 03/14/2017 03/10/2016 03/09/2015 12/06/2013  Falls in the past year? No No No Yes Yes  Number falls in past yr: - - - 1 1  Injury with Fall? - - - Yes No   Depression Screen PHQ 2/9 Scores 03/26/2018 03/14/2017 03/10/2016 03/09/2015  PHQ - 2 Score 0  0 0 0  PHQ- 9 Score 0 0 - -     Cognitive Function MMSE - Mini Mental State Exam 03/26/2018 03/10/2016  Orientation to time 5 5  Orientation to Place 5 5  Registration 3 3  Attention/ Calculation 0 0  Recall 3 3  Language- name 2 objects 0 0  Language- repeat 1 1  Language- follow 3 step command 3 3  Language- read & follow direction 0 0  Write a sentence 0 0  Copy design 0 0  Total score 20 20     PLEASE NOTE: A Mini-Cog screen was completed. Maximum score is 20. A value of 0 denotes this part of Folstein MMSE was not completed or the patient failed this part of the Mini-Cog screening.   Mini-Cog Screening Orientation to Time - Max 5 pts Orientation to Place - Max 5 pts Registration - Max 3 pts Recall - Max 3 pts Language Repeat - Max 1 pts Language Follow 3 Step Command - Max 3 pts     Immunization History  Administered Date(s) Administered  . Hepatitis A 06/24/2008  . Influenza Split 06/10/2011  . Influenza Whole 07/23/2010  . Influenza, High Dose Seasonal PF 05/08/2015, 05/16/2016  . Influenza-Unspecified 05/15/2013, 05/16/2014, 05/08/2017  . Pneumococcal Conjugate-13 12/06/2013  . Pneumococcal Polysaccharide-23 09/23/2011  . Td 05/18/2007  . Tdap 10/31/2017  . Zoster 06/29/2012    Screening Tests Health Maintenance  Topic Date Due  . INFLUENZA VACCINE  11/13/2018 (Originally 03/15/2018)  . Hepatitis C Screening  09/17/2023 (Originally Jan 04, 1946)  . MAMMOGRAM  01/02/2019  . COLONOSCOPY  10/30/2021  . TETANUS/TDAP  11/01/2027  . DEXA SCAN  Completed  . PNA vac Low Risk Adult  Completed      Plan:   I have personally reviewed, addressed, and noted the following in the  patient's chart:  A. Medical and social history B. Use of alcohol, tobacco or illicit drugs  C. Current medications and supplements D. Functional ability and status E.  Nutritional status F.  Physical activity G. Advance directives H. List of other physicians I.  Hospitalizations, surgeries, and ER visits in previous 12 months J.  [place] to include hearing, vision, cognitive, depression L. Referrals and appointments - none  In addition, I have reviewed and discussed with patient certain preventive protocols, quality metrics, and best practice recommendations. A written personalized care plan for preventive services as well as general preventive health recommendations were provided to patient.  See attached scanned questionnaire for additional information.   Signed,   Lindell Noe, MHA, BS, LPN Health Coach

## 2018-03-26 NOTE — Progress Notes (Signed)
PCP notes:   Health maintenance:  Flu vaccine - addressed  Abnormal screenings:   None  Patient concerns:   None  Nurse concerns:  None  Next PCP appt:   03/28/18 @ 1030  I reviewed health advisor's note, was available for consultation, and agree with documentation and plan. Loura Pardon MD

## 2018-03-28 ENCOUNTER — Ambulatory Visit (INDEPENDENT_AMBULATORY_CARE_PROVIDER_SITE_OTHER): Payer: Medicare Other | Admitting: Family Medicine

## 2018-03-28 ENCOUNTER — Encounter: Payer: Self-pay | Admitting: Family Medicine

## 2018-03-28 VITALS — BP 148/72 | HR 62 | Temp 98.1°F | Ht 59.5 in | Wt 143.5 lb

## 2018-03-28 DIAGNOSIS — D709 Neutropenia, unspecified: Secondary | ICD-10-CM

## 2018-03-28 DIAGNOSIS — M8589 Other specified disorders of bone density and structure, multiple sites: Secondary | ICD-10-CM | POA: Diagnosis not present

## 2018-03-28 DIAGNOSIS — I10 Essential (primary) hypertension: Secondary | ICD-10-CM | POA: Diagnosis not present

## 2018-03-28 DIAGNOSIS — E785 Hyperlipidemia, unspecified: Secondary | ICD-10-CM

## 2018-03-28 DIAGNOSIS — Z0000 Encounter for general adult medical examination without abnormal findings: Secondary | ICD-10-CM

## 2018-03-28 MED ORDER — HYDROCHLOROTHIAZIDE 25 MG PO TABS: 25.0000 mg | ORAL_TABLET | Freq: Every day | ORAL | 11 refills | Status: DC

## 2018-03-28 NOTE — Patient Instructions (Addendum)
Get your flu shot in the fall   For high blood pressure start hctz 25 mg 1 pill each am  If any intolerable side effects or problems let us know  Be sure to drink enough water  Watch out for excess sodium /processed foods Stay active   Follow up in 2 weeks   For cholesterol Avoid red meat/ fried foods/ egg yolks/ fatty breakfast meats/ butter, cheese and high fat dairy/ and shellfish

## 2018-03-28 NOTE — Progress Notes (Signed)
Subjective:    Patient ID: Melanie Marshall, female    DOB: 1946-01-20, 71 y.o.   MRN: [id number]  HPI  Here for health maintenance exam and to review chronic medical problems   Busy with family this summer- grand kids  Feeling good as well /few aches and pains    Wt Readings from Last 3 Encounters:  03/28/18 143 lb 8 oz (65.1 kg)  03/26/18 145 lb (65.8 kg)  10/17/17 149 lb (67.6 kg)  exercising regularly  Eats healthy as well -fresh veg in the summer  28.50 kg/m   amw was 8/12 No concerns  Gets flu shots every fall   Mammogram 5/19 neg Self breast exam - no lumps   Colonoscopy 3/13 with 10 y recall   zostavax 11/13   dexa 8/18  Osteopenia  Takes D ca  Exercises No falls or fractures    BP BP Readings from Last 3 Encounters:  03/28/18 (!) 148/72  03/26/18 128/82  10/17/17 122/78  she has checked at [place]  At [facility] on vacation-it was high- she was stressed and sick  Most of the time it is in the 140 range  Has cut back sodium    Pulse Readings from Last 3 Encounters:  03/28/18 62  03/26/18 68  10/17/17 69    Hyperlipidemia Lab Results  Component Value Date   CHOL 223 (H) 03/26/2018   CHOL 212 (H) 03/13/2017   CHOL 218 (H) 03/10/2016   Lab Results  Component Value Date   HDL 52.90 03/26/2018   HDL 46.40 03/13/2017   HDL 48.70 03/10/2016   Lab Results  Component Value Date   LDLCALC 140 (H) 03/26/2018   LDLCALC 131 (H) 03/13/2017   LDLCALC 131 (H) 03/10/2016   Lab Results  Component Value Date   TRIG 149.0 03/26/2018   TRIG 176.0 (H) 03/13/2017   TRIG 192.0 (H) 03/10/2016   Lab Results  Component Value Date   CHOLHDL 4 03/26/2018   CHOLHDL 5 03/13/2017   CHOLHDL 4 03/10/2016   Lab Results  Component Value Date   LDLDIRECT 138.8 02/21/2013   LDLDIRECT 193.6 11/15/2012   LDLDIRECT 143.8 09/21/2011   Ratio did not change-both HDL and LDL went up  [employer] once per mo  Red meat once per mo at most  Same with bacon or sausage     Past hx fo neutropenia Lab Results  Component Value Date   WBC 3.8 (L) 03/26/2018   HGB 13.4 03/26/2018   HCT 39.7 03/26/2018   MCV 90.1 03/26/2018   PLT 325.0 03/26/2018  no changes    Lab Results  Component Value Date   CREATININE 0.95 03/26/2018   BUN 19 03/26/2018   NA 139 03/26/2018   K 4.5 03/26/2018   CL 104 03/26/2018   CO2 27 03/26/2018   Lab Results  Component Value Date   ALT 13 03/26/2018   AST 19 03/26/2018   ALKPHOS 41 03/26/2018   BILITOT 0.7 03/26/2018   glucose 98 Lab Results  Component Value Date   TSH 1.36 03/26/2018     Patient Active Problem List   Diagnosis Date Noted  . Essential hypertension 03/14/2017  . Estrogen deficiency 03/09/2015  . Routine general medical examination at a health care facility 03/01/2015  . Encounter for Medicare annual wellness exam 11/14/2012  . Special screening for malignant neoplasms, colon 09/23/2011  . NEUTROPENIA UNSPECIFIED 07/23/2010  . Hyperlipidemia 06/24/2008  . ALLERGIC RHINITIS 04/17/2007  . Disorder of bone and  cartilage 04/17/2007  . MURMUR 04/17/2007  . MIGRAINES, HX OF 04/17/2007   Past Medical History:  Diagnosis Date  . Allergy    allergic rhinitis  . Arthritis   . Heart murmur    not treated  . History of blood transfusion 1970   after childbirth  . Hyperlipidemia   . Osteopenia    Past Surgical History:  Procedure Laterality Date  . TUBAL LIGATION  1979   Social History   Tobacco Use  . Smoking status: Former Smoker    Last attempt to quit: 08/16/1967    Years since quitting: 50.6  . Smokeless tobacco: Never Used  Substance Use Topics  . Alcohol use: Yes    Alcohol/week: 1.0 standard drinks    Types: 1 Glasses of wine per week    Comment: occ  . Drug use: No   Family History  Problem Relation Age of Onset  . Diabetes Mother   . Heart disease Mother        MI  . Hypertension Father        ??HTN  . Hyperlipidemia Father   . Cancer Father        pancreatic cancer   . Pancreatic cancer Father   . Hypertension Brother   . Cancer Brother        pancreastic (suspect)   . Cancer Paternal Grandmother        breast CA  . Breast cancer Maternal Grandmother   . Colon cancer Neg Hx   . Stomach cancer Neg Hx    No Known Allergies Current Outpatient Medications on File Prior to Visit  Medication Sig Dispense Refill  . Cholecalciferol (VITAMIN D3) 2000 units TABS Take 1 tablet by mouth daily.    [name] Kitchen ibuprofen (ADVIL,MOTRIN) 200 MG tablet Take 200 mg by mouth every 6 (six) hours as needed.    . Multiple Vitamin (MULTIVITAMIN) capsule Take 1 capsule by mouth daily.    . Multiple Vitamins-Minerals (PRESERVISION AREDS PO) Take 1 tablet by mouth 2 (two) times daily.     No current facility-administered medications on file prior to visit.     Review of Systems  Constitutional: Negative for activity change, appetite change, fatigue, fever and unexpected weight change.  HENT: Negative for congestion, ear pain, rhinorrhea, sinus pressure and sore throat.   Eyes: Negative for pain, redness and visual disturbance.  Respiratory: Negative for cough, shortness of breath and wheezing.   Cardiovascular: Negative for chest pain and palpitations.  Gastrointestinal: Negative for abdominal pain, blood in stool, constipation and diarrhea.  Endocrine: Negative for polydipsia and polyuria.  Genitourinary: Negative for dysuria, frequency and urgency.  Musculoskeletal: Positive for arthralgias. Negative for back pain and myalgias.       Aches and pains  Skin: Negative for pallor and rash.  Allergic/Immunologic: Negative for environmental allergies.  Neurological: Negative for dizziness, syncope and headaches.  Hematological: Negative for adenopathy. Does not bruise/bleed easily.  Psychiatric/Behavioral: Negative for decreased concentration and dysphoric mood. The patient is not nervous/anxious.        Objective:   Physical Exam  Constitutional: She appears well-developed  and well-nourished. No distress.  overwt and well appearing   HENT:  Head: Normocephalic and atraumatic.  Right Ear: External ear normal.  Left Ear: External ear normal.  Mouth/Throat: Oropharynx is clear and moist.  Eyes: Pupils are equal, round, and reactive to light. Conjunctivae and EOM are normal. No scleral icterus.  Neck: Normal range of motion. Neck supple. No JVD present. Carotid  bruit is not present. No thyromegaly present.  Cardiovascular: Normal rate, regular rhythm and intact distal pulses. Exam reveals no gallop.  Murmur heard. Pulmonary/Chest: Effort normal and breath sounds normal. No respiratory distress. She has no wheezes. She exhibits no tenderness. No breast tenderness, discharge or bleeding.  Abdominal: Soft. Bowel sounds are normal. She exhibits no distension, no abdominal bruit and no mass. There is no tenderness.  Genitourinary: No breast tenderness, discharge or bleeding.  Genitourinary Comments: Breast exam: No mass, nodules, thickening, tenderness, bulging, retraction, inflamation, nipple discharge or skin changes noted.  No axillary or clavicular LA.      Musculoskeletal: Normal range of motion. She exhibits no edema or tenderness.  Lymphadenopathy:    She has no cervical adenopathy.  Neurological: She is alert. She has normal reflexes. She displays normal reflexes. No cranial nerve deficit. She exhibits normal muscle tone. Coordination normal.  Skin: Skin is warm and dry. No rash noted. No erythema. No pallor.  Some lentigines   Psychiatric: She has a normal mood and affect.  pleasant          Assessment & Plan:   Problem List Items Addressed This Visit      Cardiovascular and Mediastinum   Essential hypertension    bp is not optimally controlled BP: (!) 148/72    Add start hctz 25 mg daily -update if side effects  Disc good hydration and Dash diet Wt loss and exercise  F/u approx 2 wk for visit and labs      Relevant Medications    hydrochlorothiazide (HYDRODIURIL) 25 MG tablet     Musculoskeletal and Integument   Osteopenia    Osteopenia 8/18  Disc need for calcium/ vitamin D/ wt bearing exercise and bone density test every 2 y to monitor Disc safety/ fracture risk in detail   No falls or fx On ca and D        Other   Hyperlipidemia    Disc goals for lipids and reasons to control them Rev last labs with pt Rev low sat fat diet in detail Both LDL and HDL are up -no change in ratio      Relevant Medications   hydrochlorothiazide (HYDRODIURIL) 25 MG tablet   NEUTROPENIA UNSPECIFIED    No change in baseline wbc      Routine general medical examination at a health care facility - Primary    Reviewed health habits including diet and exercise and skin cancer prevention Reviewed appropriate screening tests for age  Also reviewed health mt list, fam hx and immunization status , as well as social and family history   See HPI amw reviewed Labs rev  Enc flu shot in the fall Start hctz for HTN  Disc diet change for cholesterol

## 2018-03-31 NOTE — Assessment & Plan Note (Signed)
Disc goals for lipids and reasons to control them Rev last labs with pt Rev low sat fat diet in detail Both LDL and HDL are up -no change in ratio

## 2018-03-31 NOTE — Assessment & Plan Note (Signed)
Osteopenia 8/18  Disc need for calcium/ vitamin D/ wt bearing exercise and bone density test every 2 y to monitor Disc safety/ fracture risk in detail   No falls or fx On ca and D

## 2018-03-31 NOTE — Assessment & Plan Note (Signed)
Reviewed health habits including diet and exercise and skin cancer prevention Reviewed appropriate screening tests for age  Also reviewed health mt list, fam hx and immunization status , as well as social and family history   See HPI amw reviewed Labs rev  Enc flu shot in the fall Start hctz for HTN  Disc diet change for cholesterol

## 2018-03-31 NOTE — Assessment & Plan Note (Signed)
bp is not optimally controlled BP: (!) 148/72    Add start hctz 25 mg daily -update if side effects  Disc good hydration and Dash diet Wt loss and exercise  F/u approx 2 wk for visit and labs

## 2018-03-31 NOTE — Assessment & Plan Note (Signed)
No change in baseline wbc

## 2018-04-18 ENCOUNTER — Ambulatory Visit: Payer: Medicare Other | Admitting: Family Medicine

## 2018-04-18 ENCOUNTER — Encounter: Payer: Self-pay | Admitting: Family Medicine

## 2018-04-18 VITALS — BP 120/70 | HR 65 | Temp 97.9°F | Ht 59.0 in | Wt 142.8 lb

## 2018-04-18 DIAGNOSIS — I10 Essential (primary) hypertension: Secondary | ICD-10-CM

## 2018-04-18 DIAGNOSIS — Z23 Encounter for immunization: Secondary | ICD-10-CM | POA: Diagnosis not present

## 2018-04-18 LAB — BASIC METABOLIC PANEL
BUN: 24 mg/dL — ABNORMAL HIGH (ref 6–23)
CO2: 31 mEq/L (ref 19–32)
Calcium: 9.9 mg/dL (ref 8.4–10.5)
Chloride: 100 mEq/L (ref 96–112)
Creatinine, Ser: 1.06 mg/dL (ref 0.40–1.20)
GFR: 54.2 mL/min — ABNORMAL LOW (ref >60.00)
Glucose, Bld: 120 mg/dL — ABNORMAL HIGH (ref 70–99)
Potassium: 4.5 mEq/L (ref 3.5–5.1)
Sodium: 139 mEq/L (ref 135–145)

## 2018-04-18 NOTE — Patient Instructions (Signed)
Continue the hctz  If low bp (90/50 ish) or you continue to feel dizzy-please call and let us know   Keep up the good health habits Labs today   Flu shot today

## 2018-04-18 NOTE — Assessment & Plan Note (Signed)
BP is improved with HCTZ BP: 120/70  Pt had some side effects initially with dizziness/nausea/HA-now resolved If these return- inst to call  Also parameters for bp discussed Continue healthy diet /exercise  Lab today for bmet

## 2018-04-18 NOTE — Progress Notes (Signed)
Subjective:    Patient ID: Melanie Marshall, female    DOB: 1946-04-11, 72 y.o.   MRN: [id number]  HPI  Here for 2 wk f/u of HTN   Wt Readings from Last 3 Encounters:  04/18/18 142 lb 12 oz (64.8 kg)  03/28/18 143 lb 8 oz (65.1 kg)  03/26/18 145 lb (65.8 kg)  has not had exercise for 2 weeks- out of town and meetings  After this week- will slow down  28.83 kg/m    Last visit added hctz for HTN BP Readings from Last 3 Encounters:  04/18/18 136/76  03/28/18 (!) 148/72  03/26/18 128/82   Improved She had headaches/ nausea and dizziness (not orthostatic)  Was out of town  Did not think she had a virus  Did not take it for 2 days -then went back on it  Still a little queasy in am   She did urinate a lot  Also cut way back on sodium in diet   Wants to stick with it   Patient Active Problem List   Diagnosis Date Noted  . Essential hypertension 03/14/2017  . Estrogen deficiency 03/09/2015  . Routine general medical examination at a health care facility 03/01/2015  . Encounter for Medicare annual wellness exam 11/14/2012  . Special screening for malignant neoplasms, colon 09/23/2011  . NEUTROPENIA UNSPECIFIED 07/23/2010  . Hyperlipidemia 06/24/2008  . ALLERGIC RHINITIS 04/17/2007  . Osteopenia 04/17/2007  . MURMUR 04/17/2007  . MIGRAINES, HX OF 04/17/2007   Past Medical History:  Diagnosis Date  . Allergy    allergic rhinitis  . Arthritis   . Heart murmur    not treated  . History of blood transfusion 1970   after childbirth  . Hyperlipidemia   . Osteopenia    Past Surgical History:  Procedure Laterality Date  . TUBAL LIGATION  1979   Social History   Tobacco Use  . Smoking status: Former Smoker    Last attempt to quit: 08/16/1967    Years since quitting: 50.7  . Smokeless tobacco: Never Used  Substance Use Topics  . Alcohol use: Yes    Alcohol/week: 1.0 standard drinks    Types: 1 Glasses of wine per week    Comment: occ  . Drug use: No   Family  History  Problem Relation Age of Onset  . Diabetes Mother   . Heart disease Mother        MI  . Hypertension Father        ??HTN  . Hyperlipidemia Father   . Cancer Father        pancreatic cancer  . Pancreatic cancer Father   . Hypertension Brother   . Cancer Brother        pancreastic (suspect)   . Cancer Paternal Grandmother        breast CA  . Breast cancer Maternal Grandmother   . Colon cancer Neg Hx   . Stomach cancer Neg Hx    No Known Allergies Current Outpatient Medications on File Prior to Visit  Medication Sig Dispense Refill  . Cholecalciferol (VITAMIN D3) 2000 units TABS Take 1 tablet by mouth daily.    . hydrochlorothiazide (HYDRODIURIL) 25 MG tablet Take 1 tablet (25 mg total) by mouth daily. 30 tablet 11  . ibuprofen (ADVIL,MOTRIN) 200 MG tablet Take 200 mg by mouth every 6 (six) hours as needed.    . Multiple Vitamin (MULTIVITAMIN) capsule Take 1 capsule by mouth daily.    . Multiple Vitamins-Minerals (  PRESERVISION AREDS PO) Take 1 tablet by mouth 2 (two) times daily.     No current facility-administered medications on file prior to visit.     Review of Systems  Constitutional: Negative for activity change, appetite change, fatigue, fever and unexpected weight change.  HENT: Negative for congestion, ear pain, rhinorrhea, sinus pressure and sore throat.   Eyes: Negative for pain, redness and visual disturbance.  Respiratory: Negative for cough, shortness of breath and wheezing.   Cardiovascular: Negative for chest pain and palpitations.  Gastrointestinal: Positive for nausea. Negative for abdominal pain, blood in stool, constipation and diarrhea.       Mild nausea with hctz pill-now improved  Endocrine: Negative for polydipsia and polyuria.  Genitourinary: Negative for dysuria, frequency and urgency.  Musculoskeletal: Negative for arthralgias, back pain and myalgias.  Skin: Negative for pallor and rash.  Allergic/Immunologic: Negative for environmental  allergies.  Neurological: Negative for dizziness, syncope and headaches.       Dizziness is resolved  Hematological: Negative for adenopathy. Does not bruise/bleed easily.  Psychiatric/Behavioral: Negative for decreased concentration and dysphoric mood. The patient is not nervous/anxious.        Objective:   Physical Exam  Constitutional: She appears well-developed and well-nourished. No distress.  Well appearing   HENT:  Head: Normocephalic and atraumatic.  Mouth/Throat: Oropharynx is clear and moist.  Eyes: Pupils are equal, round, and reactive to light. Conjunctivae and EOM are normal.  No nystagmus  Neck: Normal range of motion. Neck supple. No JVD present. Carotid bruit is not present. No thyromegaly present.  Cardiovascular: Normal rate, regular rhythm, normal heart sounds and intact distal pulses. Exam reveals no gallop.  Pulmonary/Chest: Effort normal and breath sounds normal. No respiratory distress. She has no wheezes. She has no rales.  No crackles  Abdominal: She exhibits no abdominal bruit.  Musculoskeletal: She exhibits no edema.  Lymphadenopathy:    She has no cervical adenopathy.  Neurological: She is alert. She has normal reflexes. She displays normal reflexes. No cranial nerve deficit. Coordination normal.  Skin: Skin is warm and dry. No rash noted.  Psychiatric: She has a normal mood and affect.          Assessment & Plan:   Problem List Items Addressed This Visit      Cardiovascular and Mediastinum   Essential hypertension - Primary    BP is improved with HCTZ BP: 120/70  Pt had some side effects initially with dizziness/nausea/HA-now resolved If these return- inst to call  Also parameters for bp discussed Continue healthy diet /exercise  Lab today for bmet      Relevant Orders   Basic metabolic panel

## 2019-01-01 ENCOUNTER — Other Ambulatory Visit: Payer: Self-pay | Admitting: Family Medicine

## 2019-01-01 DIAGNOSIS — Z1231 Encounter for screening mammogram for malignant neoplasm of breast: Secondary | ICD-10-CM

## 2019-01-08 ENCOUNTER — Telehealth: Payer: Self-pay

## 2019-01-08 NOTE — Telephone Encounter (Signed)
LM ON machine at primary number provided for patient to please call us with an update.  I do not see any notes in Epic if patient was evaluated for her problem, but depending on which urgent care she may have gone to, we may not be able to see visit notes.

## 2019-01-08 NOTE — Telephone Encounter (Signed)
Aware, thanks!

## 2019-01-08 NOTE — Telephone Encounter (Signed)
[facility] Patient Name: Melanie Marshall Gender: Female DOB: 03/12/1946 Age: 73 Y 79 M 11 D Return Phone Number: [phone] (Primary), [phone] (Secondary) [place] [address] Client [place] Night - Client Client Site [facility], Roque Lias - MD Contact Type Call Who Is Calling Patient / Member / Family / Caregiver Call Type Triage / Clinical Relationship To Patient Self Return Phone Number [phone] (Primary) Chief Complaint Ear Foreign Body Reason for Call Symptomatic / Request for Melanie Marshall states she thinks she has a bug in her ear. Translation No Nurse Assessment Nurse: Radford Pax, RN, Eugene Garnet Date/Time Eilene Ghazi Time): 01/05/2019 5:04:22 PM Confirm and document reason for call. If symptomatic, describe symptoms. ---Caller states that she has been feeling fluttering in her left ear and thinks she may have a bug in it. Noticed this morning. Has no pain. Has the patient had close contact with a person known or suspected to have the novel coronavirus illness OR traveled / lives in area with major community spread (including international travel) in the last 14 days from the onset of symptoms? * If Asymptomatic, screen for exposure and travel within the last 14 days. ---No Does the patient have any new or worsening symptoms? ---Yes Will a triage be completed? ---Yes Related visit to physician within the last 2 weeks? ---No Does the PT have any chronic conditions? (i.e. diabetes, asthma, this includes High risk factors for pregnancy, etc.) ---Yes List chronic conditions. ---HTN Is this a behavioral health or substance abuse call? ---No Guidelines Guideline Title Affirmed Question Affirmed Notes Nurse Date/Time Eilene Ghazi Time) Ear - Foreign Body [1]  Foreign body AND [2] can't remove at home Radford Pax, [place], [place] 01/05/2019 5:06:06 PM Disp. Time Eilene Ghazi Time) Disposition Final User 01/05/2019 5:08:44 PM See PCP within 24 Hours Yes Turner, RN, Eugene Garnet PLEASE NOTE: All timestamps contained within this report are represented as [place] Standard Time. CONFIDENTIALTY NOTICE: This fax transmission is intended only for the addressee. It contains information that is legally privileged, confidential or otherwise protected from use or disclosure. If you are not the intended recipient, you are strictly prohibited from reviewing, disclosing, copying using or disseminating any of this information or taking any action in reliance on or regarding this information. If you have received this fax in error, please notify us immediately by telephone so that we can arrange for its return to [place]. Phone: [phone], Toll-Free: [phone], Fax: [phone] Page: 2 of 2 Call Id: [id number] Guadalupe Disagree/Comply Comply Caller Understands Yes PreDisposition Call Doctor Care Advice Given Per Guideline SEE PCP WITHIN 24 HOURS: * IF OFFICE WILL BE CLOSED AND NO PCP (PRIMARY CARE PROVIDER) SECONDLEVEL TRIAGE: You need to be seen within the next 24 hours. A clinic or an urgent care center is often a good source of care if your doctor's office is closed or you can't get an appointment. AVOID: * Do NOT try to remove the object by putting tweezers, cotton swab (e.g., Q-tip), a paper clip, or any other device into the ear canal. * These attempts almost always push the object in farther and make the doctor's job more difficult. CALL BACK IF: * You become worse CARE ADVICE given per Ear - Foreign Body (Adult) guideline Referrals [facility] [place]

## 2019-01-08 NOTE — Telephone Encounter (Signed)
Patient said that she was able to get the bug out of her ear by using a flashlight which seemed to attract the bug to crawl out.  She is currently doing well without any further discomfort or difficulty.

## 2019-02-19 ENCOUNTER — Other Ambulatory Visit: Payer: Self-pay

## 2019-02-19 ENCOUNTER — Ambulatory Visit
Admission: RE | Admit: 2019-02-19 | Discharge: 2019-02-19 | Disposition: A | Discharge status: Home / Self Care | Payer: Medicare Other | Source: Ambulatory Visit | Attending: Family Medicine | Admitting: Family Medicine

## 2019-02-19 DIAGNOSIS — Z1231 Encounter for screening mammogram for malignant neoplasm of breast: Secondary | ICD-10-CM

## 2019-03-21 ENCOUNTER — Other Ambulatory Visit: Payer: Self-pay | Admitting: *Deleted

## 2019-03-21 MED ORDER — HYDROCHLOROTHIAZIDE 25 MG PO TABS: 25.0000 mg | ORAL_TABLET | Freq: Every day | ORAL | 0 refills | Status: DC

## 2019-03-27 ENCOUNTER — Telehealth: Payer: Self-pay | Admitting: Family Medicine

## 2019-03-27 DIAGNOSIS — Z0000 Encounter for general adult medical examination without abnormal findings: Secondary | ICD-10-CM

## 2019-03-27 DIAGNOSIS — I10 Essential (primary) hypertension: Secondary | ICD-10-CM

## 2019-03-27 DIAGNOSIS — E7800 Pure hypercholesterolemia, unspecified: Secondary | ICD-10-CM

## 2019-03-27 NOTE — Telephone Encounter (Signed)
-----   Message from Ellamae Sia sent at 03/18/2019  3:49 PM EDT ----- Regarding: Lab orders for Thursday, 8.13.20 Patient is scheduled for CPX labs, please order future labs, Thanks , Karna Christmas

## 2019-03-28 ENCOUNTER — Other Ambulatory Visit (INDEPENDENT_AMBULATORY_CARE_PROVIDER_SITE_OTHER): Payer: Medicare Other

## 2019-03-28 ENCOUNTER — Other Ambulatory Visit: Payer: Self-pay

## 2019-03-28 DIAGNOSIS — E7800 Pure hypercholesterolemia, unspecified: Secondary | ICD-10-CM

## 2019-03-28 DIAGNOSIS — I10 Essential (primary) hypertension: Secondary | ICD-10-CM | POA: Diagnosis not present

## 2019-03-28 LAB — CBC WITH DIFFERENTIAL/PLATELET
Basophils Absolute: 0 10*3/uL (ref 0.0–0.1)
Basophils Relative: 0.6 % (ref 0.0–3.0)
Eosinophils Absolute: 0.2 10*3/uL (ref 0.0–0.7)
Eosinophils Relative: 3.1 % (ref 0.0–5.0)
HCT: 36.7 % (ref 36.0–46.0)
Hemoglobin: 12.4 g/dL (ref 12.0–15.0)
Lymphocytes Relative: 37.6 % (ref 12.0–46.0)
Lymphs Abs: 1.9 10*3/uL (ref 0.7–4.0)
MCHC: 33.9 g/dL (ref 30.0–36.0)
MCV: 90.5 fl (ref 78.0–100.0)
Monocytes Absolute: 0.5 10*3/uL (ref 0.1–1.0)
Monocytes Relative: 10.5 % (ref 3.0–12.0)
Neutro Abs: 2.5 10*3/uL (ref 1.4–7.7)
Neutrophils Relative %: 48.2 % (ref 43.0–77.0)
Platelets: 328 10*3/uL (ref 150.0–400.0)
RBC: 4.06 Mil/uL (ref 3.87–5.11)
RDW: 13.5 % (ref 11.5–15.5)
WBC: 5.2 10*3/uL (ref 4.0–10.5)

## 2019-03-28 LAB — TSH: TSH: 1.04 u[IU]/mL (ref 0.35–4.50)

## 2019-03-28 LAB — LIPID PANEL
Cholesterol: 224 mg/dL — ABNORMAL HIGH (ref 0–200)
HDL: 46.9 mg/dL (ref >39.00)
NonHDL: 177.18
Total CHOL/HDL Ratio: 5
Triglycerides: 229 mg/dL — ABNORMAL HIGH (ref 0.0–149.0)
VLDL: 45.8 mg/dL — ABNORMAL HIGH (ref 0.0–40.0)

## 2019-03-28 LAB — COMPREHENSIVE METABOLIC PANEL
ALT: 18 U/L (ref 0–35)
AST: 17 U/L (ref 0–37)
Albumin: 4.4 g/dL (ref 3.5–5.2)
Alkaline Phosphatase: 42 U/L (ref 39–117)
BUN: 24 mg/dL — ABNORMAL HIGH (ref 6–23)
CO2: 29 mEq/L (ref 19–32)
Calcium: 9.2 mg/dL (ref 8.4–10.5)
Chloride: 101 mEq/L (ref 96–112)
Creatinine, Ser: 0.98 mg/dL (ref 0.40–1.20)
GFR: 55.68 mL/min — ABNORMAL LOW (ref >60.00)
Glucose, Bld: 95 mg/dL (ref 70–99)
Potassium: 3.3 mEq/L — ABNORMAL LOW (ref 3.5–5.1)
Sodium: 137 mEq/L (ref 135–145)
Total Bilirubin: 0.5 mg/dL (ref 0.2–1.2)
Total Protein: 7.1 g/dL (ref 6.0–8.3)

## 2019-03-28 LAB — LDL CHOLESTEROL, DIRECT: Direct LDL: 141 mg/dL

## 2019-04-01 ENCOUNTER — Encounter: Payer: Self-pay | Admitting: Family Medicine

## 2019-04-01 ENCOUNTER — Other Ambulatory Visit: Payer: Self-pay

## 2019-04-01 ENCOUNTER — Ambulatory Visit: Payer: Medicare Other

## 2019-04-01 ENCOUNTER — Ambulatory Visit (INDEPENDENT_AMBULATORY_CARE_PROVIDER_SITE_OTHER): Payer: Medicare Other | Admitting: Family Medicine

## 2019-04-01 VITALS — BP 134/80 | HR 62 | Temp 98.3°F | Wt 147.4 lb

## 2019-04-01 DIAGNOSIS — I10 Essential (primary) hypertension: Secondary | ICD-10-CM | POA: Diagnosis not present

## 2019-04-01 DIAGNOSIS — E7800 Pure hypercholesterolemia, unspecified: Secondary | ICD-10-CM | POA: Diagnosis not present

## 2019-04-01 DIAGNOSIS — E2839 Other primary ovarian failure: Secondary | ICD-10-CM

## 2019-04-01 DIAGNOSIS — Z0000 Encounter for general adult medical examination without abnormal findings: Secondary | ICD-10-CM | POA: Diagnosis not present

## 2019-04-01 DIAGNOSIS — E876 Hypokalemia: Secondary | ICD-10-CM | POA: Insufficient documentation

## 2019-04-01 DIAGNOSIS — M8589 Other specified disorders of bone density and structure, multiple sites: Secondary | ICD-10-CM

## 2019-04-01 MED ORDER — HYDROCHLOROTHIAZIDE 25 MG PO TABS: 25.0000 mg | ORAL_TABLET | Freq: Every day | ORAL | 11 refills | Status: DC

## 2019-04-01 MED ORDER — POTASSIUM CHLORIDE ER 10 MEQ PO TBCR: 10.0000 meq | EXTENDED_RELEASE_TABLET | Freq: Every day | ORAL | 11 refills | Status: DC

## 2019-04-01 NOTE — Progress Notes (Signed)
Subjective:    Patient ID: Melanie Marshall, female    DOB: Sep 28, 1945, 73 y.o.   MRN: [id number]  HPI Here for amw and health mt exam and rev of chronic medical problems  I have personally reviewed the Medicare Annual Wellness questionnaire and have noted 1. The patient's medical and social history 2. Their use of alcohol, tobacco or illicit drugs 3. Their current medications and supplements 4. The patient's functional ability including ADL's, fall risks, home safety risks and hearing or visual             impairment. 5. Diet and physical activities 6. Evidence for depression or mood disorders   The patients weight, height, BMI have been recorded in the chart and visual acuity is per eye clinic.  I have made referrals, counseling and provided education to the patient based review of the above and I have provided the pt with a written personalized care plan for preventive services. Reviewed and updated provider list, see scanned forms.  See scanned forms.  Routine anticipatory guidance given to patient.  See health maintenance. Colon cancer screening colonoscopy 3/13 Breast cancer screening  Mammogram 7/20 Self breast exam- no lumps  Flu vaccine 9/19 Tetanus vaccine Tdap 3/19  Pneumovax completed Zoster vaccine  11/13 zostavax Dexa 8/18 osteopenia  Falls-none Fractures-none Supplements ca and D  Exercise - walking  Advance directive- has living will and poa  Cognitive function addressed- see scanned forms- and if abnormal then additional documentation follows.  No concerns about memory or cognition at all  Has not been lost or confused   PMH and SH reviewed  Meds, vitals, and allergies reviewed.   ROS: See HPI.  Otherwise negative.    Wt Readings from Last 3 Encounters:  04/01/19 147 lb 7 oz (66.9 kg)  04/18/18 142 lb 12 oz (64.8 kg)  03/28/18 143 lb 8 oz (65.1 kg)  wt is up a bit  Diet not as good  Not going to the gym (pandemic)  Some walking / also yard work   29.78 kg/m   [facility]may go when it cools down    [employer] Screening   125Hz  250Hz  500Hz  1000Hz  2000Hz  3000Hz  4000Hz  6000Hz  8000Hz   Right ear:   40 40 40  40    Left ear:   40 40 40  40    Vision Screening Comments: Pt had eye exam with Dr. Herbert Deaner in Nov. 2019 no problems she knows of  Goes to eye doctor yearly   bp is stable today  No cp or palpitations or headaches or edema  No side effects to medicines  BP Readings from Last 3 Encounters:  04/01/19 134/80  04/18/18 120/70  03/28/18 (!) 148/72     Lab Results  Component Value Date   CREATININE 0.98 03/28/2019   BUN 24 (H) 03/28/2019   NA 137 03/28/2019   K 3.3 (L) 03/28/2019   CL 101 03/28/2019   CO2 29 03/28/2019  on hctz   Lab Results  Component Value Date   ALT 18 03/28/2019   AST 17 03/28/2019   ALKPHOS 42 03/28/2019   BILITOT 0.5 03/28/2019   Lab Results  Component Value Date   WBC 5.2 03/28/2019   HGB 12.4 03/28/2019   HCT 36.7 03/28/2019   MCV 90.5 03/28/2019   PLT 328.0 03/28/2019   Lab Results  Component Value Date   TSH 1.04 03/28/2019     Hyperlipidemia Lab Results  Component  Value Date   CHOL 224 (H) 03/28/2019   CHOL 223 (H) 03/26/2018   CHOL 212 (H) 03/13/2017   Lab Results  Component Value Date   HDL 46.90 03/28/2019   HDL 52.90 03/26/2018   HDL 46.40 03/13/2017   Lab Results  Component Value Date   LDLCALC 140 (H) 03/26/2018   LDLCALC 131 (H) 03/13/2017   LDLCALC 131 (H) 03/10/2016   Lab Results  Component Value Date   TRIG 229.0 (H) 03/28/2019   TRIG 149.0 03/26/2018   TRIG 176.0 (H) 03/13/2017   Lab Results  Component Value Date   CHOLHDL 5 03/28/2019   CHOLHDL 4 03/26/2018   CHOLHDL 5 03/13/2017   Lab Results  Component Value Date   LDLDIRECT 141.0 03/28/2019   LDLDIRECT 138.8 02/21/2013   LDLDIRECT 193.6 11/15/2012   HDL down with less exercise  LDL up -diet  Trig up also  Needs to cut back : cheese and ice cream   occ chips    Patient Active Problem List   Diagnosis Date Noted  . Hypokalemia 04/01/2019  . Essential hypertension 03/14/2017  . Estrogen deficiency 03/09/2015  . Routine general medical examination at a health care facility 03/01/2015  . Encounter for Medicare annual wellness exam 11/14/2012  . Special screening for malignant neoplasms, colon 09/23/2011  . Hyperlipidemia 06/24/2008  . ALLERGIC RHINITIS 04/17/2007  . Osteopenia 04/17/2007  . MURMUR 04/17/2007  . MIGRAINES, HX OF 04/17/2007   Past Medical History:  Diagnosis Date  . Allergy    allergic rhinitis  . Arthritis   . Heart murmur    not treated  . History of blood transfusion 1970   after childbirth  . Hyperlipidemia   . Osteopenia    Past Surgical History:  Procedure Laterality Date  . TUBAL LIGATION  1979   Social History   Tobacco Use  . Smoking status: Former Smoker    Quit date: 08/16/1967    Years since quitting: 51.6  . Smokeless tobacco: Never Used  Substance Use Topics  . Alcohol use: Yes    Alcohol/week: 1.0 standard drinks    Types: 1 Glasses of wine per week    Comment: occ  . Drug use: No   Family History  Problem Relation Age of Onset  . Diabetes Mother   . Heart disease Mother        MI  . Hypertension Father        ??HTN  . Hyperlipidemia Father   . Cancer Father        pancreatic cancer  . Pancreatic cancer Father   . Hypertension Brother   . Cancer Brother        pancreastic (suspect)   . Cancer Paternal Grandmother        breast CA  . Breast cancer Maternal Grandmother   . Colon cancer Neg Hx   . Stomach cancer Neg Hx    No Known Allergies Current Outpatient Medications on File Prior to Visit  Medication Sig Dispense Refill  . Cholecalciferol (VITAMIN D3) 2000 units TABS Take 1 tablet by mouth daily.    [name] Kitchen ibuprofen (ADVIL,MOTRIN) 200 MG tablet Take 200 mg by mouth every 6 (six) hours as needed.    . Multiple Vitamin (MULTIVITAMIN) capsule Take 1 capsule by mouth  daily.    . Multiple Vitamins-Minerals (PRESERVISION AREDS PO) Take 1 tablet by mouth 2 (two) times daily.     No current facility-administered medications on file prior to visit.  Review of Systems  Constitutional: Negative for activity change, appetite change, fatigue, fever and unexpected weight change.  HENT: Negative for congestion, ear pain, rhinorrhea, sinus pressure and sore throat.   Eyes: Negative for pain, redness and visual disturbance.  Respiratory: Negative for cough, shortness of breath and wheezing.   Cardiovascular: Negative for chest pain and palpitations.  Gastrointestinal: Negative for abdominal pain, blood in stool, constipation and diarrhea.  Endocrine: Negative for polydipsia and polyuria.  Genitourinary: Negative for dysuria, frequency and urgency.  Musculoskeletal: Negative for arthralgias, back pain and myalgias.  Skin: Negative for pallor and rash.  Allergic/Immunologic: Negative for environmental allergies.  Neurological: Negative for dizziness, syncope and headaches.  Hematological: Negative for adenopathy. Does not bruise/bleed easily.  Psychiatric/Behavioral: Negative for decreased concentration and dysphoric mood. The patient is not nervous/anxious.        Objective:   Physical Exam Constitutional:      General: She is not in acute distress.    Appearance: Normal appearance. She is well-developed. She is obese. She is not ill-appearing.  HENT:     Head: Normocephalic and atraumatic.     Right Ear: Tympanic membrane, ear canal and external ear normal.     Left Ear: Tympanic membrane, ear canal and external ear normal.     Nose: Nose normal.     Mouth/Throat:     Mouth: Mucous membranes are moist.     Pharynx: Oropharynx is clear. No posterior oropharyngeal erythema.  Eyes:     General: No scleral icterus.    Conjunctiva/sclera: Conjunctivae normal.     Pupils: Pupils are equal, round, and reactive to light.  Neck:     Musculoskeletal: Normal  range of motion and neck supple. No neck rigidity or muscular tenderness.     Thyroid: No thyromegaly.     Vascular: No carotid bruit or JVD.  Cardiovascular:     Rate and Rhythm: Normal rate and regular rhythm.     Heart sounds: Murmur present. No gallop.   Pulmonary:     Effort: Pulmonary effort is normal. No respiratory distress.     Breath sounds: Normal breath sounds. No wheezing.     Comments: Good air exch Chest:     Chest wall: No tenderness.  Abdominal:     General: Bowel sounds are normal. There is no distension or abdominal bruit.     Palpations: Abdomen is soft. There is no mass.     Tenderness: There is no abdominal tenderness.  Genitourinary:    Comments: Breast exam: No mass, nodules, thickening, tenderness, bulging, retraction, inflamation, nipple discharge or skin changes noted.  No axillary or clavicular LA.     Musculoskeletal: Normal range of motion.        General: No tenderness.     Right lower leg: No edema.     Left lower leg: No edema.  Lymphadenopathy:     Cervical: No cervical adenopathy.  Skin:    General: Skin is warm and dry.     Coloration: Skin is not pale.     Findings: No erythema or rash.     Comments: Solar lentigines diffusely sks also - few on trunk  Neurological:     Mental Status: She is alert. Mental status is at baseline.     Cranial Nerves: No cranial nerve deficit.     Motor: No abnormal muscle tone.     Coordination: Coordination normal.     Gait: Gait normal.     Deep Tendon Reflexes: Reflexes  are normal and symmetric. Reflexes normal.  Psychiatric:        Mood and Affect: Mood normal.        Cognition and Memory: Cognition and memory normal.           Assessment & Plan:   Problem List Items Addressed This Visit      Cardiovascular and Mediastinum   Essential hypertension    bp in fair control at this time  BP Readings from Last 1 Encounters:  04/01/19 134/80   No changes needed Most recent labs reviewed  Disc  lifstyle change with low sodium diet and exercise        Relevant Medications   hydrochlorothiazide (HYDRODIURIL) 25 MG tablet     Musculoskeletal and Integument   Osteopenia    Due for 2 y dexa- ref done Taking ca/D and walking  No falls or fx          Other   Hyperlipidemia    LDL is up with bad diet and HDL down Disc goals for lipids and reasons to control them Rev last labs with pt Rev low sat fat diet in detail Will work on this Handout given  Cut back on cheese and ice cream  If no imp next check consider statin      Relevant Medications   hydrochlorothiazide (HYDRODIURIL) 25 MG tablet   Encounter for Medicare annual wellness exam - Primary    Reviewed health habits including diet and exercise and skin cancer prevention Reviewed appropriate screening tests for age  Also reviewed health mt list, fam hx and immunization status , as well as social and family history   See HPI Labs rev  Enc her to get a flu shot in the fall  Ref for dexa (no falls or f)  Enc continued walking for exercise Pt has adv directive No cogntive concerns Hearing and vision are both good Disc low cholesterol diet      Routine general medical examination at a health care facility    Reviewed health habits including diet and exercise and skin cancer prevention Reviewed appropriate screening tests for age  Also reviewed health mt list, fam hx and immunization status , as well as social and family history   See HPI Labs rev  Enc her to get a flu shot in the fall  Ref for dexa (no falls or f)  Enc continued walking for exercise Pt has adv directive No cogntive concerns Hearing and vision are both good Disc low cholesterol diet      Estrogen deficiency   Relevant Orders   DG Bone Density   Hypokalemia    From hctz No symptoms Px k dur 10 meq to take once daily

## 2019-04-01 NOTE — Assessment & Plan Note (Signed)
Reviewed health habits including diet and exercise and skin cancer prevention Reviewed appropriate screening tests for age  Also reviewed health mt list, fam hx and immunization status , as well as social and family history   See HPI Labs rev  Enc her to get a flu shot in the fall  Ref for dexa (no falls or f)  Enc continued walking for exercise Pt has adv directive No cogntive concerns Hearing and vision are both good Disc low cholesterol diet

## 2019-04-01 NOTE — Assessment & Plan Note (Signed)
bp in fair control at this time  BP Readings from Last 1 Encounters:  04/01/19 134/80   No changes needed Most recent labs reviewed  Disc lifstyle change with low sodium diet and exercise

## 2019-04-01 NOTE — Assessment & Plan Note (Signed)
LDL is up with bad diet and HDL down Disc goals for lipids and reasons to control them Rev last labs with pt Rev low sat fat diet in detail Will work on this Handout given  Cut back on cheese and ice cream  If no imp next check consider statin

## 2019-04-01 NOTE — Assessment & Plan Note (Signed)
From hctz No symptoms Px k dur 10 meq to take once daily

## 2019-04-01 NOTE — Assessment & Plan Note (Signed)
Due for 2 y dexa- ref done Taking ca/D and walking  No falls or fx

## 2019-04-01 NOTE — Patient Instructions (Addendum)
Get a flu shot in the fall   Stop up front to schedule your bone density test   Start taking potassium once daily with food  We will re check level in 1 month   For cholesterol Avoid red meat/ fried foods/ egg yolks/ fatty breakfast meats/ butter, cheese and high fat dairy/ and shellfish   See the handout

## 2019-04-04 ENCOUNTER — Other Ambulatory Visit: Payer: Self-pay

## 2019-04-04 ENCOUNTER — Ambulatory Visit
Admission: RE | Admit: 2019-04-04 | Discharge: 2019-04-04 | Disposition: A | Discharge status: Home / Self Care | Payer: Medicare Other | Source: Ambulatory Visit | Attending: Family Medicine | Admitting: Family Medicine

## 2019-04-04 DIAGNOSIS — E2839 Other primary ovarian failure: Secondary | ICD-10-CM

## 2019-05-02 ENCOUNTER — Other Ambulatory Visit (INDEPENDENT_AMBULATORY_CARE_PROVIDER_SITE_OTHER): Payer: Medicare Other

## 2019-05-02 DIAGNOSIS — E876 Hypokalemia: Secondary | ICD-10-CM

## 2019-05-02 LAB — BASIC METABOLIC PANEL
BUN: 20 mg/dL (ref 6–23)
CO2: 30 mEq/L (ref 19–32)
Calcium: 9.5 mg/dL (ref 8.4–10.5)
Chloride: 99 mEq/L (ref 96–112)
Creatinine, Ser: 1 mg/dL (ref 0.40–1.20)
GFR: 54.38 mL/min — ABNORMAL LOW (ref >60.00)
Glucose, Bld: 97 mg/dL (ref 70–99)
Potassium: 3.5 mEq/L (ref 3.5–5.1)
Sodium: 136 mEq/L (ref 135–145)

## 2019-08-05 ENCOUNTER — Encounter: Payer: Self-pay | Admitting: Family Medicine

## 2019-10-18 ENCOUNTER — Ambulatory Visit: Payer: Medicare PPO | Attending: Internal Medicine

## 2019-10-18 DIAGNOSIS — Z23 Encounter for immunization: Secondary | ICD-10-CM | POA: Insufficient documentation

## 2019-10-18 NOTE — Progress Notes (Signed)
   Covid-19 Vaccination Clinic  Name:  Melanie Marshall    MRN: [id number] DOB: [birth year]  10/18/2019  Melanie Marshall was observed post Covid-19 immunization for 15 minutes without incident. She was provided with Vaccine Information Sheet and instruction to access the V-Safe system.   Melanie Marshall was instructed to call 911 with any severe reactions post vaccine: [name] Kitchen Difficulty breathing  . Swelling of face and throat  . A fast heartbeat  . A bad rash all over body  . Dizziness and weakness

## 2019-11-08 ENCOUNTER — Ambulatory Visit: Payer: Medicare PPO | Attending: Internal Medicine

## 2019-11-08 DIAGNOSIS — Z23 Encounter for immunization: Secondary | ICD-10-CM

## 2019-11-08 NOTE — Progress Notes (Signed)
   Covid-[facility] Vaccination [facility]  Name:  Melanie Marshall    MRN: [id number] DOB: 03-May-1946  11/08/2019  Melanie Marshall was observed post Covid-19 immunization for 15 minutes without incident. She was provided with Vaccine Information Sheet and instruction to access the V-Safe system.   Melanie Marshall was instructed to call 911 with any severe reactions post vaccine: [name] Kitchen Difficulty breathing  . Swelling of face and throat  . A fast heartbeat  . A bad rash all over body  . Dizziness and weakness   Immunizations Administered    Name Date Dose VIS Date Route   Pfizer COVID-19 Vaccine 11/08/2019  9:27 AM 0.3 mL 07/26/2019 Intramuscular   Manufacturer: [place]   Lot: [id number]   [place]: [id number]

## 2020-01-07 ENCOUNTER — Other Ambulatory Visit: Payer: Self-pay | Admitting: Family Medicine

## 2020-01-07 DIAGNOSIS — Z1231 Encounter for screening mammogram for malignant neoplasm of breast: Secondary | ICD-10-CM

## 2020-02-20 ENCOUNTER — Other Ambulatory Visit: Payer: Self-pay

## 2020-02-20 ENCOUNTER — Ambulatory Visit
Admission: RE | Admit: 2020-02-20 | Discharge: 2020-02-20 | Disposition: A | Discharge status: Home / Self Care | Payer: Medicare PPO | Source: Ambulatory Visit | Attending: Family Medicine | Admitting: Family Medicine

## 2020-02-20 DIAGNOSIS — Z1231 Encounter for screening mammogram for malignant neoplasm of breast: Secondary | ICD-10-CM | POA: Diagnosis not present

## 2020-02-20 IMAGING — MG DIGITAL SCREENING BILAT W/ TOMO W/ CAD
8 series · 9 of 24 positions shown · non-contrast
Comparison: Previous exam(s).

CLINICAL DATA: Screening.

EXAM:
DIGITAL SCREENING BILATERAL MAMMOGRAM WITH TOMO AND CAD

[R CC synth-2D]
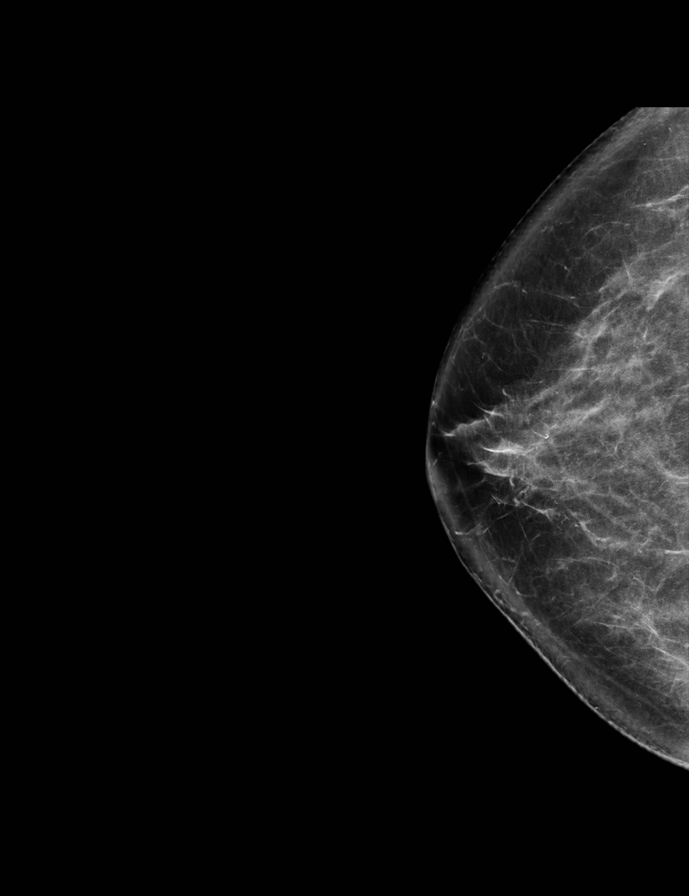

[R MLO synth-2D]
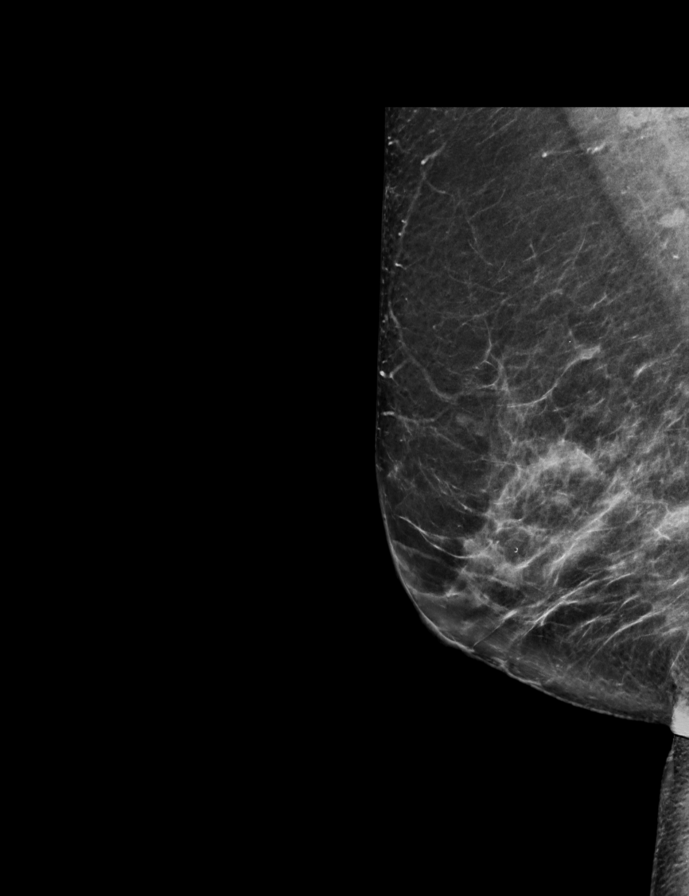

[L MLO synth-2D]
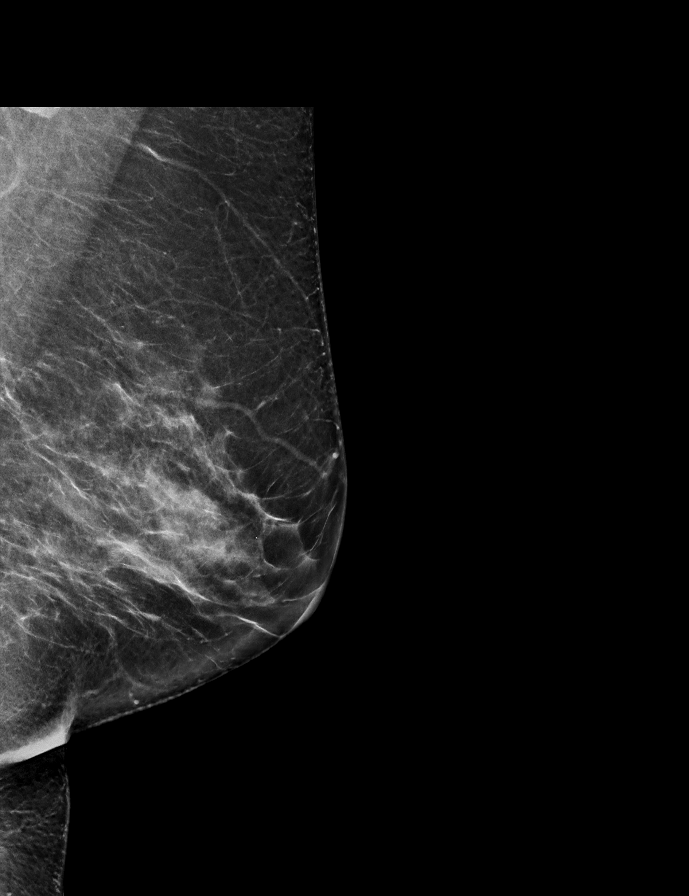

[L CC synth-2D]
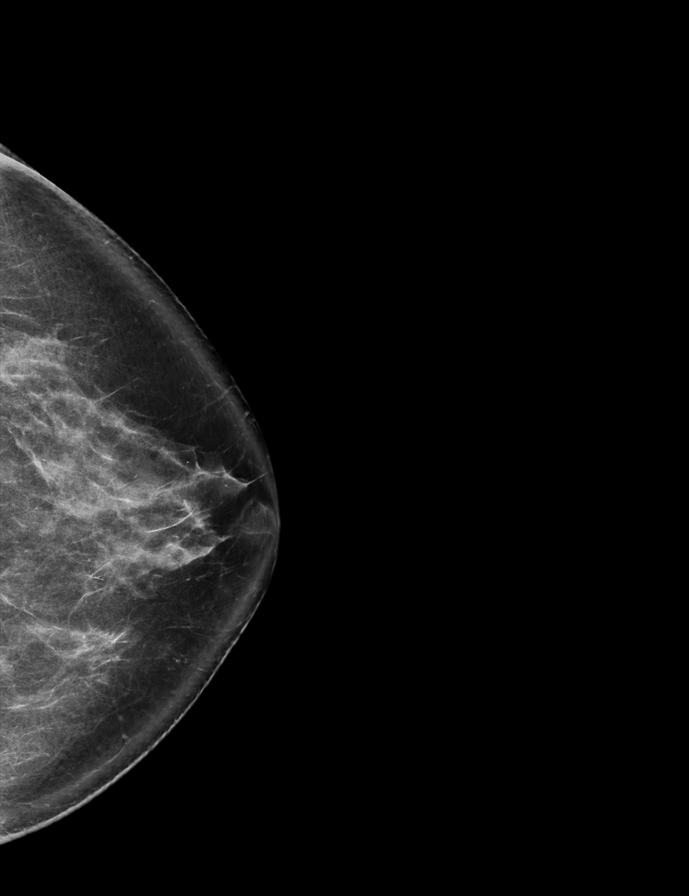

[L CC tomo · 2 of 75 frames shown]
[frame 25/75]
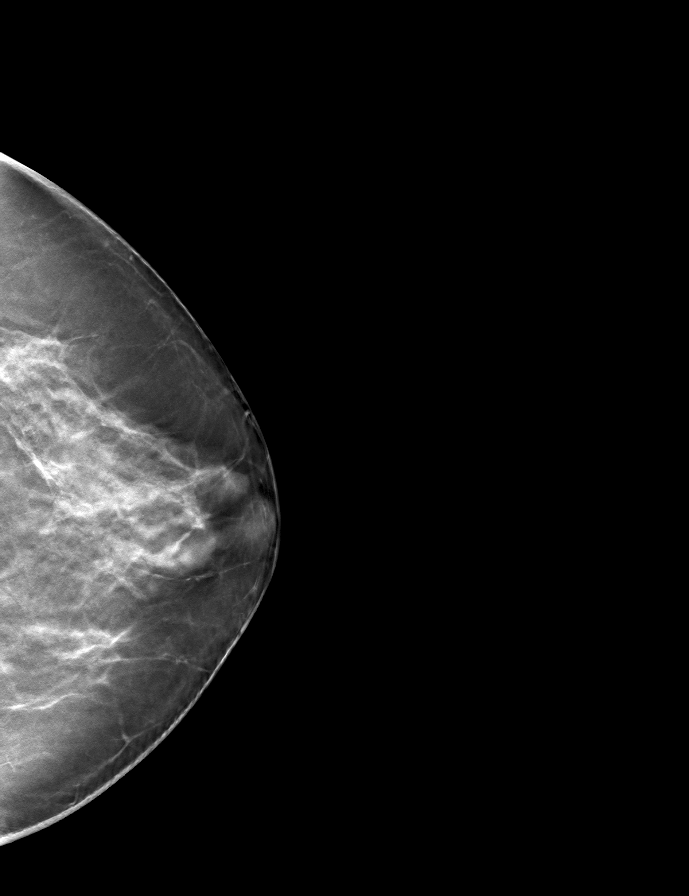
[frame 38/75]
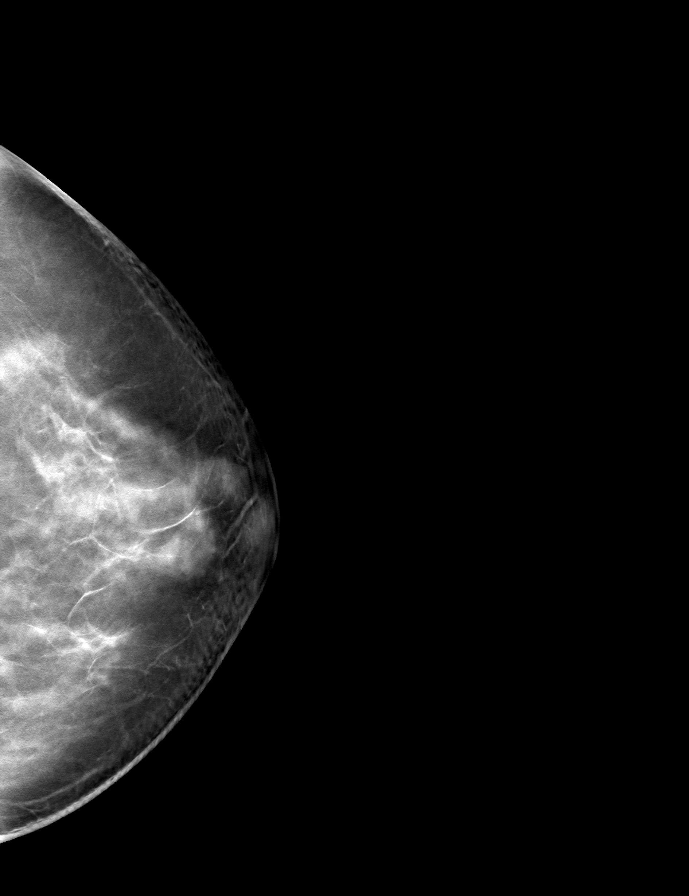

[R MLO tomo · tomo slice 35/68.0]
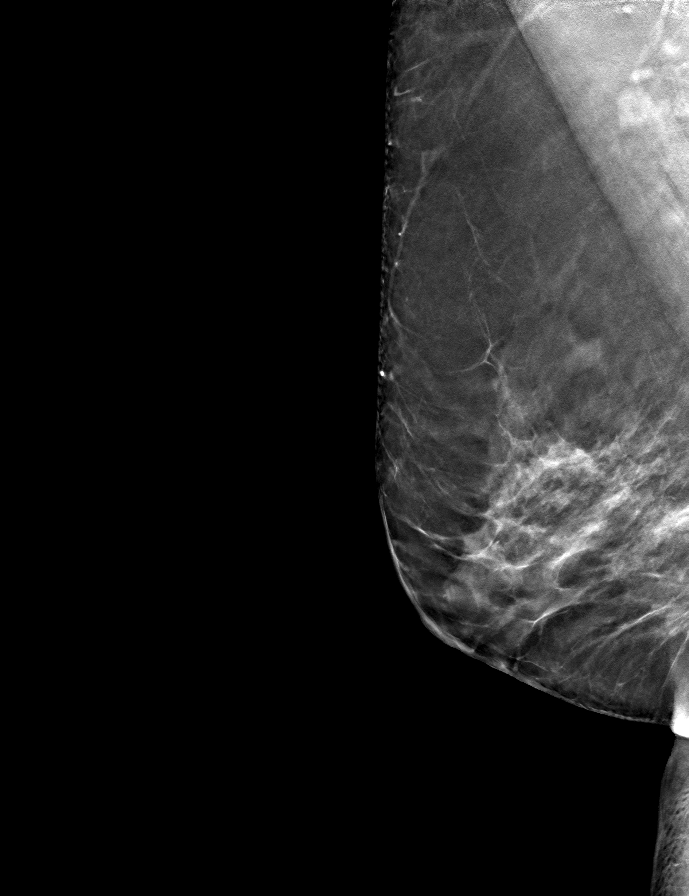

[R CC tomo · tomo slice 36/71.0]
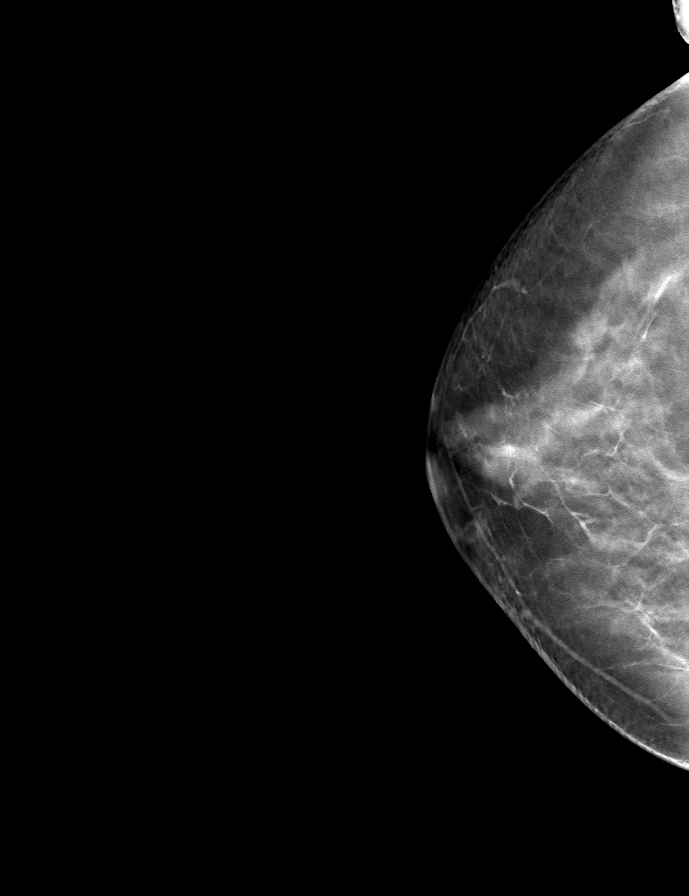

[L MLO tomo · tomo slice 39/76.0]
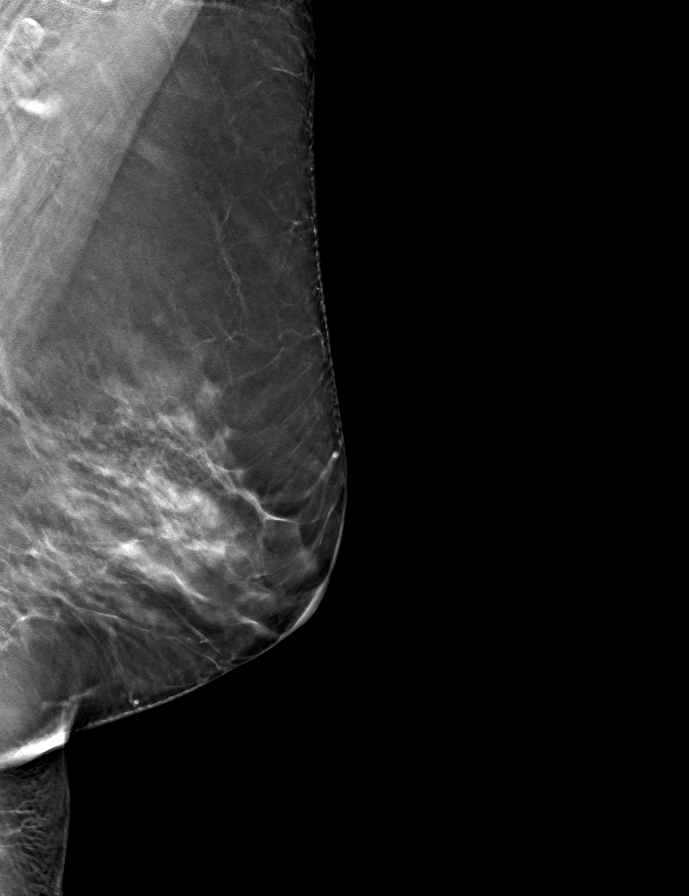

[9 of 24 positions shown; findings below may reference images not displayed]

ACR Breast Density Category c: The breast tissue is heterogeneously
dense, which may obscure small masses.
FINDINGS: There are no findings suspicious for malignancy. Images were
processed with CAD.
IMPRESSION: No mammographic evidence of malignancy. A result letter of this
screening mammogram will be mailed directly to the patient.

RECOMMENDATION:
Screening mammogram in one year. (Code:[5V])

BI-RADS CATEGORY  1: Negative.

## 2020-03-19 ENCOUNTER — Other Ambulatory Visit: Payer: Self-pay | Admitting: Family Medicine

## 2020-03-31 ENCOUNTER — Telehealth: Payer: Self-pay | Admitting: Family Medicine

## 2020-03-31 DIAGNOSIS — I10 Essential (primary) hypertension: Secondary | ICD-10-CM

## 2020-03-31 DIAGNOSIS — E7800 Pure hypercholesterolemia, unspecified: Secondary | ICD-10-CM

## 2020-03-31 NOTE — Telephone Encounter (Signed)
-----   Message from Cloyd Stagers, RT sent at 03/16/2020  2:35 PM EDT ----- Regarding: Lab Orders for Wednesday 8.18.2021 Please place lab orders for Wednesday 8.18.2021, office visit for physical on Friday 8.20.2021 Thank you, Dyke Maes RT(R)

## 2020-04-01 ENCOUNTER — Other Ambulatory Visit (INDEPENDENT_AMBULATORY_CARE_PROVIDER_SITE_OTHER): Payer: Medicare PPO

## 2020-04-01 ENCOUNTER — Other Ambulatory Visit: Payer: Self-pay

## 2020-04-01 ENCOUNTER — Ambulatory Visit (INDEPENDENT_AMBULATORY_CARE_PROVIDER_SITE_OTHER): Payer: Medicare PPO

## 2020-04-01 DIAGNOSIS — E7800 Pure hypercholesterolemia, unspecified: Secondary | ICD-10-CM | POA: Diagnosis not present

## 2020-04-01 DIAGNOSIS — Z0000 Encounter for general adult medical examination without abnormal findings: Secondary | ICD-10-CM

## 2020-04-01 DIAGNOSIS — I10 Essential (primary) hypertension: Secondary | ICD-10-CM | POA: Diagnosis not present

## 2020-04-01 LAB — COMPREHENSIVE METABOLIC PANEL
ALT: 11 U/L (ref 0–35)
AST: 15 U/L (ref 0–37)
Albumin: 4.5 g/dL (ref 3.5–5.2)
Alkaline Phosphatase: 41 U/L (ref 39–117)
BUN: 16 mg/dL (ref 6–23)
CO2: 28 mEq/L (ref 19–32)
Calcium: 9.4 mg/dL (ref 8.4–10.5)
Chloride: 97 mEq/L (ref 96–112)
Creatinine, Ser: 1.04 mg/dL (ref 0.40–1.20)
GFR: 51.85 mL/min — ABNORMAL LOW (ref >60.00)
Glucose, Bld: 98 mg/dL (ref 70–99)
Potassium: 3.9 mEq/L (ref 3.5–5.1)
Sodium: 134 mEq/L — ABNORMAL LOW (ref 135–145)
Total Bilirubin: 0.6 mg/dL (ref 0.2–1.2)
Total Protein: 7 g/dL (ref 6.0–8.3)

## 2020-04-01 LAB — LIPID PANEL
Cholesterol: 222 mg/dL — ABNORMAL HIGH (ref 0–200)
HDL: 46.5 mg/dL (ref >39.00)
LDL Cholesterol: 141 mg/dL — ABNORMAL HIGH (ref 0–99)
NonHDL: 175.11
Total CHOL/HDL Ratio: 5
Triglycerides: 172 mg/dL — ABNORMAL HIGH (ref 0.0–149.0)
VLDL: 34.4 mg/dL (ref 0.0–40.0)

## 2020-04-01 LAB — CBC WITH DIFFERENTIAL/PLATELET
Basophils Absolute: 0 10*3/uL (ref 0.0–0.1)
Basophils Relative: 0.5 % (ref 0.0–3.0)
Eosinophils Absolute: 0.1 10*3/uL (ref 0.0–0.7)
Eosinophils Relative: 2.3 % (ref 0.0–5.0)
HCT: 38 % (ref 36.0–46.0)
Hemoglobin: 13.1 g/dL (ref 12.0–15.0)
Lymphocytes Relative: 47 % — ABNORMAL HIGH (ref 12.0–46.0)
Lymphs Abs: 1.8 10*3/uL (ref 0.7–4.0)
MCHC: 34.5 g/dL (ref 30.0–36.0)
MCV: 89.1 fl (ref 78.0–100.0)
Monocytes Absolute: 0.4 10*3/uL (ref 0.1–1.0)
Monocytes Relative: 10.7 % (ref 3.0–12.0)
Neutro Abs: 1.5 10*3/uL (ref 1.4–7.7)
Neutrophils Relative %: 39.5 % — ABNORMAL LOW (ref 43.0–77.0)
Platelets: 327 10*3/uL (ref 150.0–400.0)
RBC: 4.27 Mil/uL (ref 3.87–5.11)
RDW: 13.4 % (ref 11.5–15.5)
WBC: 3.9 10*3/uL — ABNORMAL LOW (ref 4.0–10.5)

## 2020-04-01 LAB — TSH: TSH: 1.69 u[IU]/mL (ref 0.35–4.50)

## 2020-04-01 NOTE — Patient Instructions (Signed)
Melanie Marshall , Thank you for taking time to come for your Medicare Wellness Visit. I appreciate your ongoing commitment to your health goals. Please review the following plan we discussed and let me know if I can assist you in the future.   Screening recommendations/referrals: Colonoscopy: Up to date, completed 10/31/2011, due 10/2021 Mammogram: Up to date, completed 02/20/2020, due 02/2021 Bone Density: Up to date, completed 04/04/2019, due in 2 years  Recommended yearly ophthalmology/optometry visit for glaucoma screening and checkup Recommended yearly dental visit for hygiene and checkup  Vaccinations: Influenza vaccine: due, will be available in the office at the end of August Pneumococcal vaccine: Completed series Tdap vaccine: Up to date, completed 10/31/2017, due 10/2027 Shingles vaccine: due, check with your insurance regarding coverage   Covid-19:Completed series  Advanced directives: Please bring a copy of your POA (Power of [place]) and/or Living Will to your next appointment.   Conditions/risks identified: hypertension, hyperlipidemia  Next appointment: Follow up in one year for your annual wellness visit    Preventive Care 52 Years and Older, Female Preventive care refers to lifestyle choices and visits with your health care provider that can promote health and wellness. What does preventive care include?  A yearly physical exam. This is also called an annual well check.  Dental exams once or twice a year.  Routine eye exams. Ask your health care provider how often you should have your eyes checked.  Personal lifestyle choices, including:  Daily care of your teeth and gums.  Regular physical activity.  Eating a healthy diet.  Avoiding tobacco and drug use.  Limiting alcohol use.  Practicing safe sex.  Taking low-dose aspirin every day.  Taking vitamin and mineral supplements as recommended by your health care provider. What happens during an annual well  check? The services and screenings done by your health care provider during your annual well check will depend on your age, overall health, lifestyle risk factors, and family history of disease. Counseling  Your health care provider may ask you questions about your:  Alcohol use.  Tobacco use.  Drug use.  Emotional well-being.  Home and relationship well-being.  Sexual activity.  Eating habits.  History of falls.  Memory and ability to understand (cognition).  Work and work Statistician.  Reproductive health. Screening  You may have the following tests or measurements:  Height, weight, and BMI.  Blood pressure.  Lipid and cholesterol levels. These may be checked every 5 years, or more frequently if you are over 63 years old.  Skin check.  Lung cancer screening. You may have this screening every year starting at age 34 if you have a 30-pack-year history of smoking and currently smoke or have quit within the past 15 years.  Fecal occult blood test (FOBT) of the stool. You may have this test every year starting at age 48.  Flexible sigmoidoscopy or colonoscopy. You may have a sigmoidoscopy every 5 years or a colonoscopy every 10 years starting at age 69.  Hepatitis C blood test.  Hepatitis B blood test.  Sexually transmitted disease (STD) testing.  Diabetes screening. This is done by checking your blood sugar (glucose) after you have not eaten for a while (fasting). You may have this done every 1-3 years.  Bone density scan. This is done to screen for osteoporosis. You may have this done starting at age 12.  Mammogram. This may be done every 1-2 years. Talk to your health care provider about how often you should have regular mammograms.  Talk with your health care provider about your test results, treatment options, and if necessary, the need for more tests. Vaccines  Your health care provider may recommend certain vaccines, such as:  Influenza vaccine. This is  recommended every year.  Tetanus, diphtheria, and acellular pertussis (Tdap, Td) vaccine. You may need a Td booster every 10 years.  Zoster vaccine. You may need this after age 44.  Pneumococcal 13-valent conjugate (PCV13) vaccine. One dose is recommended after age 60.  Pneumococcal polysaccharide (PPSV23) vaccine. One dose is recommended after age 4. Talk to your health care provider about which screenings and vaccines you need and how often you need them. This information is not intended to replace advice given to you by your health care provider. Make sure you discuss any questions you have with your health care provider. Document Released: 08/28/2015 Document Revised: 04/20/2016 Document Reviewed: 06/02/2015 Elsevier Interactive Patient Education  2017 [employer] Prevention in the Home Falls can cause injuries. They can happen to people of all ages. There are many things you can do to make your home safe and to help prevent falls. What can I do on the outside of my home?  Regularly fix the edges of walkways and driveways and fix any cracks.  Remove anything that might make you trip as you walk through a door, such as a raised step or threshold.  Trim any bushes or trees on the path to your home.  Use bright outdoor lighting.  Clear any walking paths of anything that might make someone trip, such as rocks or tools.  Regularly check to see if handrails are loose or broken. Make sure that both sides of any steps have handrails.  Any raised decks and porches should have guardrails on the edges.  Have any leaves, snow, or ice cleared regularly.  Use sand or salt on walking paths during winter.  Clean up any spills in your garage right away. This includes oil or grease spills. What can I do in the bathroom?  Use night lights.  Install grab bars by the toilet and in the tub and shower. Do not use towel bars as grab bars.  Use non-skid mats or decals in the tub or  shower.  If you need to sit down in the shower, use a plastic, non-slip stool.  Keep the floor dry. Clean up any water that spills on the floor as soon as it happens.  Remove soap buildup in the tub or shower regularly.  Attach bath mats securely with double-sided non-slip rug tape.  Do not have throw rugs and other things on the floor that can make you trip. What can I do in the bedroom?  Use night lights.  Make sure that you have a light by your bed that is easy to reach.  Do not use any sheets or blankets that are too big for your bed. They should not hang down onto the floor.  Have a firm chair that has side arms. You can use this for support while you get dressed.  Do not have throw rugs and other things on the floor that can make you trip. What can I do in the kitchen?  Clean up any spills right away.  Avoid walking on wet floors.  Keep items that you use a lot in easy-to-reach places.  If you need to reach something above you, use a strong step stool that has a grab bar.  Keep electrical cords out of the way.  Do not use floor polish or wax that makes floors slippery. If you must use wax, use non-skid floor wax.  Do not have throw rugs and other things on the floor that can make you trip. What can I do with my stairs?  Do not leave any items on the stairs.  Make sure that there are handrails on both sides of the stairs and use them. Fix handrails that are broken or loose. Make sure that handrails are as long as the stairways.  Check any carpeting to make sure that it is firmly attached to the stairs. Fix any carpet that is loose or worn.  Avoid having throw rugs at the top or bottom of the stairs. If you do have throw rugs, attach them to the floor with carpet tape.  Make sure that you have a light switch at the top of the stairs and the bottom of the stairs. If you do not have them, ask someone to add them for you. What else can I do to help prevent  falls?  Wear shoes that:  Do not have high heels.  Have rubber bottoms.  Are comfortable and fit you well.  Are closed at the toe. Do not wear sandals.  If you use a stepladder:  Make sure that it is fully opened. Do not climb a closed stepladder.  Make sure that both sides of the stepladder are locked into place.  Ask someone to hold it for you, if possible.  Clearly mark and make sure that you can see:  Any grab bars or handrails.  First and last steps.  Where the edge of each step is.  Use tools that help you move around (mobility aids) if they are needed. These include:  Canes.  Walkers.  Scooters.  Crutches.  Turn on the lights when you go into a dark area. Replace any light bulbs as soon as they burn out.  Set up your furniture so you have a clear path. Avoid moving your furniture around.  If any of your floors are uneven, fix them.  If there are any pets around you, be aware of where they are.  Review your medicines with your doctor. Some medicines can make you feel dizzy. This can increase your chance of falling. Ask your doctor what other things that you can do to help prevent falls. This information is not intended to replace advice given to you by your health care provider. Make sure you discuss any questions you have with your health care provider. Document Released: 05/28/2009 Document Revised: 01/07/2016 Document Reviewed: 09/05/2014 Elsevier Interactive Patient Education  2017 [employer].

## 2020-04-01 NOTE — Progress Notes (Signed)
PCP notes:  Health Maintenance: Flu- due Shingrix- due   Abnormal Screenings: none   Patient concerns: none   Nurse concerns: none   Next PCP appt.: 04/03/2020 @ 8:30 am

## 2020-04-01 NOTE — Progress Notes (Signed)
Subjective:   Melanie Marshall is a 74 y.o. female who presents for Medicare Annual (Subsequent) preventive examination.  Review of Systems: N/A      I connected with the patient today by telephone and verified that I am speaking with the correct person using two identifiers. Location patient: home Location nurse: work Persons participating in the virtual visit: patient, Marine scientist.   I discussed the limitations, risks, security and privacy concerns of performing an evaluation and management service by telephone and the availability of in person appointments. I also discussed with the patient that there may be a patient responsible charge related to this service. The patient expressed understanding and verbally consented to this telephonic visit.    Interactive audio and video telecommunications were attempted between this nurse and patient, however failed, due to patient having technical difficulties OR patient did not have access to video capability.  We continued and completed visit with audio only.     Cardiac Risk Factors include: advanced age (>53men, >5 women);hypertension;dyslipidemia     Objective:    Today's Vitals   There is no height or weight on file to calculate BMI.  Advanced Directives 04/01/2020 03/26/2018 03/10/2016  Does Patient Have a Medical Advance Directive? Yes Yes Yes  Type of Paramedic of [place];Living will [place];Living will Living will;Healthcare Power of Attorney  Does patient want to make changes to medical advance directive? - - No - Patient declined  Copy of [place] in Chart? No - copy requested Yes Yes    Current Medications (verified) Outpatient Encounter Medications as of 04/01/2020  Medication Sig  . Cholecalciferol (VITAMIN D3) 2000 units TABS Take 1 tablet by mouth daily.  . hydrochlorothiazide (HYDRODIURIL) 25 MG tablet TAKE 1 TABLET BY MOUTH EVERY DAY  . ibuprofen (ADVIL,MOTRIN)  200 MG tablet Take 200 mg by mouth every 6 (six) hours as needed.  . Multiple Vitamin (MULTIVITAMIN) capsule Take 1 capsule by mouth daily.  . Multiple Vitamins-Minerals (PRESERVISION AREDS PO) Take 1 tablet by mouth 2 (two) times daily.  . potassium chloride (KLOR-CON) 10 MEQ tablet TAKE 1 TABLET BY MOUTH EVERY DAY   No facility-administered encounter medications on file as of 04/01/2020.    Allergies (verified) Patient has no known allergies.   History: Past Medical History:  Diagnosis Date  . Allergy    allergic rhinitis  . Arthritis   . Heart murmur    not treated  . History of blood transfusion 1970   after childbirth  . Hyperlipidemia   . Osteopenia    Past Surgical History:  Procedure Laterality Date  . TUBAL LIGATION  1979   Family History  Problem Relation Age of Onset  . Diabetes Mother   . Heart disease Mother        MI  . Hypertension Father        ??HTN  . Hyperlipidemia Father   . Cancer Father        pancreatic cancer  . Pancreatic cancer Father   . Hypertension Brother   . Cancer Brother        pancreastic (suspect)   . Cancer Paternal Grandmother        breast CA  . Breast cancer Maternal Grandmother   . Colon cancer Neg Hx   . Stomach cancer Neg Hx    Social History   Socioeconomic History  . Marital status: Widowed    Spouse name: Not on file  . Number of  children: Not on file  . Years of education: Not on file  . Highest education level: Not on file  Occupational History  . Not on file  Tobacco Use  . Smoking status: Former Smoker    Quit date: 08/16/1967    Years since quitting: 52.6  . Smokeless tobacco: Never Used  Vaping Use  . Vaping Use: Never used  Substance and Sexual Activity  . Alcohol use: Yes    Alcohol/week: 1.0 standard drink    Types: 1 Glasses of wine per week    Comment: occ  . Drug use: No  . Sexual activity: Never  Other Topics Concern  . Not on file  Social History Narrative  . Not on file   Social  Determinants of Health   Financial Resource Strain: Low Risk   . Difficulty of Paying Living Expenses: Not hard at all  Food Insecurity: No Food Insecurity  . Worried About Charity fundraiser in the Last Year: Never true  . Ran Out of Food in the Last Year: Never true  Transportation Needs: No Transportation Needs  . Lack of Transportation (Medical): No  . Lack of Transportation (Non-Medical): No  Physical Activity: Insufficiently Active  . Days of Exercise per Week: 2 days  . Minutes of Exercise per Session: 50 min  Stress: No Stress Concern Present  . Feeling of Stress : Not at all  Social Connections:   . Frequency of Communication with Friends and Family:   . Frequency of Social Gatherings with Friends and Family:   . Attends Religious Services:   . Active Member of Clubs or Organizations:   . Attends Archivist Meetings:   [name] Kitchen Marital Status:     Tobacco Counseling Counseling given: Not Answered   Clinical Intake:  Pre-visit preparation completed: Yes  Pain : No/denies pain     Nutritional Risks: None Diabetes: No  How often do you need to have someone help you when you read instructions, pamphlets, or other written materials from your doctor or pharmacy?: 1 - Never What is the last grade level you completed in school?: 3 years of college  Diabetic: No Nutrition Risk Assessment:  Has the patient had any N/V/D within the last 2 months?  No  Does the patient have any non-healing wounds?  No  Has the patient had any unintentional weight loss or weight gain?  No   Diabetes:  Is the patient diabetic?  No  If diabetic, was a CBG obtained today?  N/A Did the patient bring in their glucometer from home?  N/A How often do you monitor your CBG's? N/A.   Financial Strains and Diabetes Management:  Are you having any financial strains with the device, your supplies or your medication? N/A.  Does the patient want to be seen by Chronic Care Management for  management of their diabetes?  N/A Would the patient like to be referred to a Nutritionist or for Diabetic Management?  N/A     Interpreter Needed?: No  Information entered by :: CJohnson, LPN   Activities of Daily Living In your present state of health, do you have any difficulty performing the following activities: 04/01/2020  Hearing? N  Vision? N  Difficulty concentrating or making decisions? N  Walking or climbing stairs? N  Dressing or bathing? N  Doing errands, shopping? N  Preparing Food and eating ? N  Using the Toilet? N  In the past six months, have you accidently leaked urine? N  Do you have problems with loss of bowel control? N  Managing your Medications? N  Managing your Finances? N  Housekeeping or managing your Housekeeping? N  Some recent data might be hidden    Patient Care Team: Tower, Wynelle Fanny, MD as PCP - General Conley Simmonds, DDS as Referring Physician (Dentistry) Monna Fam, MD as Consulting Physician (Ophthalmology)  Indicate any recent Medical Services you may have received from other than [facility] providers in the past year (date may be approximate).     Assessment:   This is a routine wellness examination for [place].  Hearing/Vision screen  Hearing Screening   125Hz  250Hz  500Hz  1000Hz  2000Hz  3000Hz  4000Hz  6000Hz  8000Hz   Right ear:           Left ear:           Vision Screening Comments: Patient gets annual eye exams   Dietary issues and exercise activities discussed: Current Exercise Habits: Structured exercise class, Type of exercise: strength training/weights, Time (Minutes): 45, Frequency (Times/Week): 2, Weekly Exercise (Minutes/Week): 90, Intensity: Moderate, Exercise limited by: None identified  Goals    . Increase physical activity     Starting 03/26/2018, I will continue to exercise for at least 60 min 2-4 days per week.     . Patient Stated     04/01/2020, I will continue to go to the [facility] 2 days a week for 45 minutes doing  strength training classes.      Depression Screen PHQ 2/9 Scores 04/01/2020 04/01/2019 03/26/2018 03/14/2017 03/10/2016 03/09/2015 12/06/2013  PHQ - 2 Score 0 0 0 0 0 0 0  PHQ- 9 Score 0 - 0 0 - - -    Fall Risk Fall Risk  04/01/2020 04/01/2019 04/01/2019 03/26/2018 03/14/2017  Falls in the past year? 0 0 0 No No  Number falls in past yr: 0 0 0 - -  Injury with Fall? 0 0 0 - -  Risk for fall due to : Medication side effect - - - -  Follow up Falls evaluation completed;Falls prevention discussed - Falls evaluation completed - -    Any stairs in or around the home? Yes  If so, are there any without handrails? No  Home free of loose throw rugs in walkways, pet beds, electrical cords, etc? Yes  Adequate lighting in your home to reduce risk of falls? Yes   ASSISTIVE DEVICES UTILIZED TO PREVENT FALLS:  Life alert? No  Use of a cane, walker or w/c? No  Grab bars in the bathroom? No  Shower chair or bench in shower? No  Elevated toilet seat or a handicapped toilet? No   TIMED UP AND GO:  Was the test performed? N/A, telephonic visit .    Cognitive Function: MMSE - Mini Mental State Exam 04/01/2020 03/26/2018 03/10/2016  Orientation to time 5 5 5   Orientation to Place 5 5 5   Registration 3 3 3   Attention/ Calculation 5 0 0  Recall 3 3 3   Language- name 2 objects - 0 0  Language- repeat 1 1 1   Language- follow 3 step command - 3 3  Language- read & follow direction - 0 0  Write a sentence - 0 0  Copy design - 0 0  Total score - 20 20  Mini Cog  Mini-Cog screen was completed. Maximum score is 22. A value of 0 denotes this part of the MMSE was not completed or the patient failed this part of the Mini-Cog screening.  Immunizations Immunization History  Administered Date(s) Administered  . Fluad Quad(high Dose 65+) 04/25/2019  . Hepatitis A 06/24/2008  . Influenza Split 06/10/2011  . Influenza Whole 07/23/2010  . Influenza, High Dose Seasonal PF 05/08/2015, 05/16/2016  .  Influenza,inj,Quad PF,6+ Mos 04/18/2018  . Influenza-Unspecified 05/15/2013, 05/16/2014, 05/08/2017  . PFIZER SARS-COV-2 Vaccination 10/18/2019, 11/08/2019  . Pneumococcal Conjugate-13 12/06/2013  . Pneumococcal Polysaccharide-23 09/23/2011  . Td 05/18/2007  . Tdap 10/31/2017  . Zoster 06/29/2012    TDAP status: Up to date Flu Vaccine status: due, will be available in the office at the end of August Pneumococcal vaccine status: Up to date Covid-19 vaccine status: Completed vaccines  Qualifies for Shingles Vaccine? Yes   Zostavax completed Yes   Shingrix Completed?: No.    Education has been provided regarding the importance of this vaccine. Patient has been advised to call insurance company to determine out of pocket expense if they have not yet received this vaccine. Advised may also receive vaccine at local pharmacy or Health Dept. Verbalized acceptance and understanding.  Screening Tests Health Maintenance  Topic Date Due  . INFLUENZA VACCINE  03/15/2020  . Hepatitis C Screening  09/17/2023 (Originally 1946-05-17)  . MAMMOGRAM  02/19/2021  . COLONOSCOPY  10/30/2021  . TETANUS/TDAP  11/01/2027  . DEXA SCAN  Completed  . COVID-19 Vaccine  Completed  . PNA vac Low Risk Adult  Completed    Health Maintenance  Health Maintenance Due  Topic Date Due  . INFLUENZA VACCINE  03/15/2020    Colorectal cancer screening: Completed 10/31/2011. Repeat every 10 years Mammogram status: Completed 02/20/2020. Repeat every year Bone Density status: Completed 04/04/2019. Results reflect: Bone density results: OSTEOPENIA. Repeat every 2 years.  Lung Cancer Screening: (Low Dose CT Chest recommended if Age 7-80 years, 30 pack-year currently smoking OR have quit w/in 15 years.) does not qualify.     Additional Screening:  Hepatitis C Screening: does qualify; Completed declined  Vision Screening: Recommended annual ophthalmology exams for early detection of glaucoma and other disorders of the  eye. Is the patient up to date with their annual eye exam?  Yes  Who is the provider or what is the name of the office in which the patient attends annual eye exams? Dr. Monna Fam If pt is not established with a provider, would they like to be referred to a provider to establish care? No .   Dental Screening: Recommended annual dental exams for proper oral hygiene  Community Resource Referral / Chronic Care Management: CRR required this visit?  No   CCM required this visit?  No      Plan:     I have personally reviewed and noted the following in the patient's chart:   . Medical and social history . Use of alcohol, tobacco or illicit drugs  . Current medications and supplements . Functional ability and status . Nutritional status . Physical activity . Advanced directives . List of other physicians . Hospitalizations, surgeries, and ER visits in previous 12 months . Vitals . Screenings to include cognitive, depression, and falls . Referrals and appointments  In addition, I have reviewed and discussed with patient certain preventive protocols, quality metrics, and best practice recommendations. A written personalized care plan for preventive services as well as general preventive health recommendations were provided to patient.   Due to this being a telephonic visit, the after visit summary with patients personalized plan was offered to patient via mail or my-chart. Patient preferred to pick up at  office at next visit.   Andrez Grime, LPN   12/30/9840

## 2020-04-03 ENCOUNTER — Ambulatory Visit (INDEPENDENT_AMBULATORY_CARE_PROVIDER_SITE_OTHER): Payer: Medicare PPO | Admitting: Family Medicine

## 2020-04-03 ENCOUNTER — Encounter: Payer: Self-pay | Admitting: Family Medicine

## 2020-04-03 ENCOUNTER — Other Ambulatory Visit: Payer: Self-pay

## 2020-04-03 VITALS — BP 135/70 | HR 63 | Temp 96.7°F | Ht 59.15 in | Wt 146.6 lb

## 2020-04-03 DIAGNOSIS — I10 Essential (primary) hypertension: Secondary | ICD-10-CM | POA: Diagnosis not present

## 2020-04-03 DIAGNOSIS — E7800 Pure hypercholesterolemia, unspecified: Secondary | ICD-10-CM | POA: Diagnosis not present

## 2020-04-03 DIAGNOSIS — Z0000 Encounter for general adult medical examination without abnormal findings: Secondary | ICD-10-CM | POA: Diagnosis not present

## 2020-04-03 DIAGNOSIS — M8589 Other specified disorders of bone density and structure, multiple sites: Secondary | ICD-10-CM

## 2020-04-03 MED ORDER — POTASSIUM CHLORIDE ER 10 MEQ PO TBCR: 10.0000 meq | EXTENDED_RELEASE_TABLET | Freq: Every day | ORAL | 3 refills | Status: DC

## 2020-04-03 MED ORDER — HYDROCHLOROTHIAZIDE 25 MG PO TABS: 25.0000 mg | ORAL_TABLET | Freq: Every day | ORAL | 3 refills | Status: DC

## 2020-04-03 NOTE — Progress Notes (Signed)
Subjective:    Patient ID: Melanie Marshall, female    DOB: 1946-03-24, 74 y.o.   MRN: [id number]  This visit occurred during the SARS-CoV-2 public health emergency.  Safety protocols were in place, including screening questions prior to the visit, additional usage of staff PPE, and extensive cleaning of exam room while observing appropriate contact time as indicated for disinfecting solutions.    HPI Here for health maintenance exam and to review chronic medical problems    Wt Readings from Last 3 Encounters:  04/03/20 146 lb 9 oz (66.5 kg)  04/01/19 147 lb 7 oz (66.9 kg)  04/18/18 142 lb 12 oz (64.8 kg)   29.45 kg/m   Doing well  Has traveled a bit- [place] to see her son    She had amw on 8/18  Disc shingrix vaccine -will get at the pharmacy   She did have zostavax 11/13 Also covid immunized   Flu shot -will get in the fall   Mammogram 7/21 Self breast exam -no breast lumps   Colonoscopy 3/13   dexa 8/20 -osteopenia Falls-none  fx-none  Supplements taking vit D Exercise - yard work/stairs, [facility]   HTN bp is stable today  No cp or palpitations or headaches or edema  No side effects to medicines  BP Readings from Last 3 Encounters:  04/03/20 (!) 150/84  04/01/19 134/80  04/18/18 120/70     Re check BP: 135/70   Pulse Readings from Last 3 Encounters:  04/03/20 63  04/01/19 62  04/18/18 65    Hyperlipidemia Lab Results  Component Value Date   CHOL 222 (H) 04/01/2020   CHOL 224 (H) 03/28/2019   CHOL 223 (H) 03/26/2018   Lab Results  Component Value Date   HDL 46.50 04/01/2020   HDL 46.90 03/28/2019   HDL 52.90 03/26/2018   Lab Results  Component Value Date   LDLCALC 141 (H) 04/01/2020   LDLCALC 140 (H) 03/26/2018   LDLCALC 131 (H) 03/13/2017   Lab Results  Component Value Date   TRIG 172.0 (H) 04/01/2020   TRIG 229.0 (H) 03/28/2019   TRIG 149.0 03/26/2018   Lab Results  Component Value Date   CHOLHDL 5  04/01/2020   CHOLHDL 5 03/28/2019   CHOLHDL 4 03/26/2018   Lab Results  Component Value Date   LDLDIRECT 141.0 03/28/2019   LDLDIRECT 138.8 02/21/2013   LDLDIRECT 193.6 11/15/2012   Numbers are the same  Really careful with diet   Red meat twice per mo / avoids bacon and sausage Lots of produce  Declines medication at this time   Lab Results  Component Value Date   WBC 3.9 (L) 04/01/2020   HGB 13.1 04/01/2020   HCT 38.0 04/01/2020   MCV 89.1 04/01/2020   PLT 327.0 04/01/2020    Lab Results  Component Value Date   CREATININE 1.04 04/01/2020   BUN 16 04/01/2020   NA 134 (L) 04/01/2020   K 3.9 04/01/2020   CL 97 04/01/2020   CO2 28 04/01/2020   Lab Results  Component Value Date   TSH 1.69 04/01/2020    Patient Active Problem List   Diagnosis Date Noted   Hypokalemia 04/01/2019   Essential hypertension 03/14/2017   Estrogen deficiency 03/09/2015   Routine general medical examination at a health care facility 03/01/2015   Encounter for Medicare annual wellness exam 11/14/2012   Special screening for malignant neoplasms, colon 09/23/2011   Hyperlipidemia 06/24/2008  ALLERGIC RHINITIS 04/17/2007   Osteopenia 04/17/2007   MURMUR 04/17/2007   MIGRAINES, HX OF 04/17/2007   Past Medical History:  Diagnosis Date   Allergy    allergic rhinitis   Arthritis    Heart murmur    not treated   History of blood transfusion 1970   after childbirth   Hyperlipidemia    Osteopenia    Past Surgical History:  Procedure Laterality Date   TUBAL LIGATION  1979   Social History   Tobacco Use   Smoking status: Former Smoker    Quit date: 08/16/1967    Years since quitting: 52.6   Smokeless tobacco: Never Used  Vaping Use   Vaping Use: Never used  Substance Use Topics   Alcohol use: Yes    Alcohol/week: 1.0 standard drink    Types: 1 Glasses of wine per week    Comment: occ   Drug use: No   Family History  Problem Relation Age of Onset    Diabetes Mother    Heart disease Mother        MI   Hypertension Father        ??HTN   Hyperlipidemia Father    Cancer Father        pancreatic cancer   Pancreatic cancer Father    Hypertension Brother    Cancer Brother        pancreastic (suspect)    Cancer Paternal Grandmother        breast CA   Breast cancer Maternal Grandmother    Colon cancer Neg Hx    Stomach cancer Neg Hx    No Known Allergies Current Outpatient Medications on File Prior to Visit  Medication Sig Dispense Refill   Cholecalciferol (VITAMIN D3) 2000 units TABS Take 1 tablet by mouth daily.     ibuprofen (ADVIL,MOTRIN) 200 MG tablet Take 200 mg by mouth every 6 (six) hours as needed.     Multiple Vitamin (MULTIVITAMIN) capsule Take 1 capsule by mouth daily.     Multiple Vitamins-Minerals (PRESERVISION AREDS PO) Take 1 tablet by mouth 2 (two) times daily.     No current facility-administered medications on file prior to visit.     Review of Systems  Constitutional: Negative for activity change, appetite change, fatigue, fever and unexpected weight change.  HENT: Negative for congestion, ear pain, rhinorrhea, sinus pressure and sore throat.   Eyes: Negative for pain, redness and visual disturbance.  Respiratory: Negative for cough, shortness of breath and wheezing.   Cardiovascular: Negative for chest pain and palpitations.  Gastrointestinal: Negative for abdominal pain, blood in stool, constipation and diarrhea.  Endocrine: Negative for polydipsia and polyuria.  Genitourinary: Negative for dysuria, frequency and urgency.  Musculoskeletal: Negative for arthralgias, back pain and myalgias.  Skin: Negative for pallor and rash.  Allergic/Immunologic: Negative for environmental allergies.  Neurological: Negative for dizziness, syncope and headaches.  Hematological: Negative for adenopathy. Does not bruise/bleed easily.  Psychiatric/Behavioral: Negative for decreased concentration and dysphoric  mood. The patient is not nervous/anxious.        Objective:   Physical Exam Constitutional:      General: She is not in acute distress.    Appearance: Normal appearance. She is well-developed. She is obese. She is not ill-appearing or diaphoretic.  HENT:     Head: Normocephalic and atraumatic.     Right Ear: Tympanic membrane, ear canal and external ear normal.     Left Ear: Tympanic membrane, ear canal and external ear normal.  Nose: Nose normal. No congestion.     Mouth/Throat:     Mouth: Mucous membranes are moist.     Pharynx: Oropharynx is clear. No posterior oropharyngeal erythema.  Eyes:     General: No scleral icterus.    Extraocular Movements: Extraocular movements intact.     Conjunctiva/sclera: Conjunctivae normal.     Pupils: Pupils are equal, round, and reactive to light.  Neck:     Thyroid: No thyromegaly.     Vascular: No carotid bruit or JVD.  Cardiovascular:     Rate and Rhythm: Normal rate and regular rhythm.     Pulses: Normal pulses.     Heart sounds: Normal heart sounds. No gallop.   Pulmonary:     Effort: Pulmonary effort is normal. No respiratory distress.     Breath sounds: Normal breath sounds. No wheezing.     Comments: Good air exch Chest:     Chest wall: No tenderness.  Abdominal:     General: Bowel sounds are normal. There is no distension or abdominal bruit.     Palpations: Abdomen is soft. There is no mass.     Tenderness: There is no abdominal tenderness.     Hernia: No hernia is present.  Genitourinary:    Comments: Breast exam: No mass, nodules, thickening, tenderness, bulging, retraction, inflamation, nipple discharge or skin changes noted.  No axillary or clavicular LA.     Musculoskeletal:        General: No tenderness. Normal range of motion.     Cervical back: Normal range of motion and neck supple. No rigidity. No muscular tenderness.     Right lower leg: No edema.     Left lower leg: No edema.  Lymphadenopathy:     Cervical:  No cervical adenopathy.  Skin:    General: Skin is warm and dry.     Coloration: Skin is not pale.     Findings: No erythema or rash.     Comments: Solar lentigines diffusely Some SKs  Neurological:     Mental Status: She is alert. Mental status is at baseline.     Cranial Nerves: No cranial nerve deficit.     Motor: No abnormal muscle tone.     Coordination: Coordination normal.     Gait: Gait normal.     Deep Tendon Reflexes: Reflexes are normal and symmetric. Reflexes normal.  Psychiatric:        Mood and Affect: Mood normal.        Cognition and Memory: Cognition and memory normal.           Assessment & Plan:   Problem List Items Addressed This Visit      Cardiovascular and Mediastinum   Essential hypertension    bp in fair control at this time  BP Readings from Last 1 Encounters:  04/03/20 135/70   No changes needed Most recent labs reviewed  Disc lifstyle change with low sodium diet and exercise        Relevant Medications   hydrochlorothiazide (HYDRODIURIL) 25 MG tablet     Musculoskeletal and Integument   Osteopenia    dexa 8/20 reviewed No falls or fx Is inc her exercise  Takes vit D Can repeat dexa in a year        Other   Hyperlipidemia    Disc goals for lipids and reasons to control them Rev last labs with pt Rev low sat fat diet in detail LDL remains in 140s despite very good  diet  I suspect she will need low dose statin for better control but she declines this currently  Will continue to monitor       Relevant Medications   hydrochlorothiazide (HYDRODIURIL) 25 MG tablet   Routine general medical examination at a health care facility - Primary    Reviewed health habits including diet and exercise and skin cancer prevention Reviewed appropriate screening tests for age  Also reviewed health mt list, fam hx and immunization status , as well as social and family history   See HPI Labs reviewed amw reviewed  Discussed shingrix vaccine    Will get flu shot in the fall  Is covid immunized dexa utd (no falls or fx)

## 2020-04-03 NOTE — Patient Instructions (Addendum)
Get the shingrix vaccine at your pharmacy   Take care of yourself  Start some regular exercise  Labs are stable

## 2020-04-05 NOTE — Assessment & Plan Note (Signed)
bp in fair control at this time  BP Readings from Last 1 Encounters:  04/03/20 135/70   No changes needed Most recent labs reviewed  Disc lifstyle change with low sodium diet and exercise

## 2020-04-05 NOTE — Assessment & Plan Note (Signed)
dexa 8/20 reviewed No falls or fx Is inc her exercise  Takes vit D Can repeat dexa in a year

## 2020-04-05 NOTE — Assessment & Plan Note (Signed)
Reviewed health habits including diet and exercise and skin cancer prevention Reviewed appropriate screening tests for age  Also reviewed health mt list, fam hx and immunization status , as well as social and family history   See HPI Labs reviewed amw reviewed  Discussed shingrix vaccine  Will get flu shot in the fall  Is covid immunized dexa utd (no falls or fx)

## 2020-04-05 NOTE — Assessment & Plan Note (Signed)
Disc goals for lipids and reasons to control them Rev last labs with pt Rev low sat fat diet in detail LDL remains in 140s despite very good diet  I suspect she will need low dose statin for better control but she declines this currently  Will continue to monitor

## 2020-04-22 DIAGNOSIS — E669 Obesity, unspecified: Secondary | ICD-10-CM | POA: Diagnosis not present

## 2020-04-22 DIAGNOSIS — Z6830 Body mass index (BMI) 30.0-30.9, adult: Secondary | ICD-10-CM | POA: Diagnosis not present

## 2020-04-22 DIAGNOSIS — Z87891 Personal history of nicotine dependence: Secondary | ICD-10-CM | POA: Diagnosis not present

## 2020-04-22 DIAGNOSIS — Z809 Family history of malignant neoplasm, unspecified: Secondary | ICD-10-CM | POA: Diagnosis not present

## 2020-04-22 DIAGNOSIS — Z8249 Family history of ischemic heart disease and other diseases of the circulatory system: Secondary | ICD-10-CM | POA: Diagnosis not present

## 2020-04-22 DIAGNOSIS — Z833 Family history of diabetes mellitus: Secondary | ICD-10-CM | POA: Diagnosis not present

## 2020-04-22 DIAGNOSIS — I10 Essential (primary) hypertension: Secondary | ICD-10-CM | POA: Diagnosis not present

## 2020-06-12 DIAGNOSIS — H16141 Punctate keratitis, right eye: Secondary | ICD-10-CM | POA: Diagnosis not present

## 2020-06-15 DIAGNOSIS — H16141 Punctate keratitis, right eye: Secondary | ICD-10-CM | POA: Diagnosis not present

## 2020-10-26 ENCOUNTER — Encounter: Payer: Self-pay | Admitting: Family Medicine

## 2020-10-26 DIAGNOSIS — H2513 Age-related nuclear cataract, bilateral: Secondary | ICD-10-CM | POA: Diagnosis not present

## 2020-10-26 DIAGNOSIS — H353132 Nonexudative age-related macular degeneration, bilateral, intermediate dry stage: Secondary | ICD-10-CM | POA: Diagnosis not present

## 2020-10-26 DIAGNOSIS — H524 Presbyopia: Secondary | ICD-10-CM | POA: Diagnosis not present

## 2020-10-26 DIAGNOSIS — H35033 Hypertensive retinopathy, bilateral: Secondary | ICD-10-CM | POA: Diagnosis not present

## 2020-10-26 DIAGNOSIS — H25013 Cortical age-related cataract, bilateral: Secondary | ICD-10-CM | POA: Diagnosis not present

## 2021-02-08 ENCOUNTER — Other Ambulatory Visit: Payer: Self-pay | Admitting: Family Medicine

## 2021-02-08 DIAGNOSIS — Z1231 Encounter for screening mammogram for malignant neoplasm of breast: Secondary | ICD-10-CM

## 2021-04-01 ENCOUNTER — Ambulatory Visit
Admission: RE | Admit: 2021-04-01 | Discharge: 2021-04-01 | Disposition: A | Discharge status: Home / Self Care | Payer: Medicare PPO | Source: Ambulatory Visit | Attending: Family Medicine | Admitting: Family Medicine

## 2021-04-01 ENCOUNTER — Other Ambulatory Visit: Payer: Self-pay

## 2021-04-01 DIAGNOSIS — Z1231 Encounter for screening mammogram for malignant neoplasm of breast: Secondary | ICD-10-CM | POA: Diagnosis not present

## 2021-04-05 ENCOUNTER — Other Ambulatory Visit: Payer: Self-pay

## 2021-04-05 ENCOUNTER — Ambulatory Visit (INDEPENDENT_AMBULATORY_CARE_PROVIDER_SITE_OTHER): Payer: Medicare PPO | Admitting: Family Medicine

## 2021-04-05 ENCOUNTER — Encounter: Payer: Self-pay | Admitting: Family Medicine

## 2021-04-05 VITALS — BP 136/70 | HR 63 | Temp 97.8°F | Ht 59.25 in | Wt 150.6 lb

## 2021-04-05 DIAGNOSIS — E2839 Other primary ovarian failure: Secondary | ICD-10-CM

## 2021-04-05 DIAGNOSIS — Z0000 Encounter for general adult medical examination without abnormal findings: Secondary | ICD-10-CM | POA: Diagnosis not present

## 2021-04-05 DIAGNOSIS — I10 Essential (primary) hypertension: Secondary | ICD-10-CM | POA: Diagnosis not present

## 2021-04-05 DIAGNOSIS — M8589 Other specified disorders of bone density and structure, multiple sites: Secondary | ICD-10-CM

## 2021-04-05 DIAGNOSIS — E7800 Pure hypercholesterolemia, unspecified: Secondary | ICD-10-CM | POA: Diagnosis not present

## 2021-04-05 LAB — COMPREHENSIVE METABOLIC PANEL
ALT: 16 U/L (ref 0–35)
AST: 18 U/L (ref 0–37)
Albumin: 4.4 g/dL (ref 3.5–5.2)
Alkaline Phosphatase: 41 U/L (ref 39–117)
BUN: 29 mg/dL — ABNORMAL HIGH (ref 6–23)
CO2: 27 mEq/L (ref 19–32)
Calcium: 9 mg/dL (ref 8.4–10.5)
Chloride: 105 mEq/L (ref 96–112)
Creatinine, Ser: 0.99 mg/dL (ref 0.40–1.20)
GFR: 56.1 mL/min — ABNORMAL LOW (ref >60.00)
Glucose, Bld: 72 mg/dL (ref 70–99)
Potassium: 3.9 mEq/L (ref 3.5–5.1)
Sodium: 141 mEq/L (ref 135–145)
Total Bilirubin: 0.4 mg/dL (ref 0.2–1.2)
Total Protein: 7.2 g/dL (ref 6.0–8.3)

## 2021-04-05 LAB — CBC WITH DIFFERENTIAL/PLATELET
Basophils Absolute: 0 10*3/uL (ref 0.0–0.1)
Basophils Relative: 0.6 % (ref 0.0–3.0)
Eosinophils Absolute: 0.1 10*3/uL (ref 0.0–0.7)
Eosinophils Relative: 2.5 % (ref 0.0–5.0)
HCT: 37.4 % (ref 36.0–46.0)
Hemoglobin: 12.5 g/dL (ref 12.0–15.0)
Lymphocytes Relative: 37.1 % (ref 12.0–46.0)
Lymphs Abs: 1.6 10*3/uL (ref 0.7–4.0)
MCHC: 33.5 g/dL (ref 30.0–36.0)
MCV: 90.2 fl (ref 78.0–100.0)
Monocytes Absolute: 0.4 10*3/uL (ref 0.1–1.0)
Monocytes Relative: 10.3 % (ref 3.0–12.0)
Neutro Abs: 2.2 10*3/uL (ref 1.4–7.7)
Neutrophils Relative %: 49.5 % (ref 43.0–77.0)
Platelets: 334 10*3/uL (ref 150.0–400.0)
RBC: 4.14 Mil/uL (ref 3.87–5.11)
RDW: 13.8 % (ref 11.5–15.5)
WBC: 4.4 10*3/uL (ref 4.0–10.5)

## 2021-04-05 LAB — LIPID PANEL
Cholesterol: 219 mg/dL — ABNORMAL HIGH (ref 0–200)
HDL: 52.7 mg/dL (ref >39.00)
LDL Cholesterol: 139 mg/dL — ABNORMAL HIGH (ref 0–99)
NonHDL: 165.86
Total CHOL/HDL Ratio: 4
Triglycerides: 132 mg/dL (ref 0.0–149.0)
VLDL: 26.4 mg/dL (ref 0.0–40.0)

## 2021-04-05 LAB — TSH: TSH: 1.47 u[IU]/mL (ref 0.35–5.50)

## 2021-04-05 MED ORDER — POTASSIUM CHLORIDE ER 10 MEQ PO TBCR: 10.0000 meq | EXTENDED_RELEASE_TABLET | Freq: Every day | ORAL | 3 refills | Status: DC

## 2021-04-05 MED ORDER — HYDROCHLOROTHIAZIDE 25 MG PO TABS: 25.0000 mg | ORAL_TABLET | Freq: Every day | ORAL | 3 refills | Status: DC

## 2021-04-05 NOTE — Assessment & Plan Note (Signed)
Reviewed health habits including diet and exercise and skin cancer prevention Reviewed appropriate screening tests for age  Also reviewed health mt list, fam hx and immunization status , as well as social and family history   See HPI Labs ordered  Colonoscopy is due in march-pt aware Mammogram utd  imms utd dexa ordered-pt will schedule  Taking vit D and no falls/fractures  Advance directive utd No cognitive concerns  Considering pap since pt has new partner (monogamous) - she plans to check on ins coverage for that  Hearing screen reassuring  Vision/eye care utd

## 2021-04-05 NOTE — Patient Instructions (Addendum)
Colonoscopy is due in march - if you don't get a reminder let us know   You are due for bone density test Please call to schedule   Take care of yourself   For cholesterol Avoid red meat/ fried foods/ egg yolks/ fatty breakfast meats/ butter, cheese and high fat dairy/ and shellfish   [place] sausage is lower in sat fat   Labs today

## 2021-04-05 NOTE — Progress Notes (Signed)
Subjective:    Patient ID: KHRISTINE VEREB, female    DOB: 16-Jun-1946, 75 y.o.   MRN: [id number]  This visit occurred during the SARS-CoV-2 public health emergency.  Safety protocols were in place, including screening questions prior to the visit, additional usage of staff PPE, and extensive cleaning of exam room while observing appropriate contact time as indicated for disinfecting solutions.   HPI Pt presents for amw and health mt visit with rev of chronic medical problems   I have personally reviewed the Medicare Annual Wellness questionnaire and have noted 1. The patient's medical and social history 2. Their use of alcohol, tobacco or illicit drugs 3. Their current medications and supplements 4. The patient's functional ability including ADL's, fall risks, home safety risks and hearing or visual             impairment. 5. Diet and physical activities 6. Evidence for depression or mood disorders  The patients weight, height, BMI have been recorded in the chart and visual acuity is per eye clinic.  I have made referrals, counseling and provided education to the patient based review of the above and I have provided the pt with a written personalized care plan for preventive services. Reviewed and updated provider list, see scanned forms.  See scanned forms.  Routine anticipatory guidance given to patient.  See health maintenance. Colon cancer screening colonoscopy 3/13 -due in march  Breast cancer screening  mammogram 8/22 Self breast exam- no lumps  Flu vaccine-fall Tetanus vaccine 3/19 Pneumovax completed Covid vaccinated Zoster vaccine-had shingrix series Dexa 8/20 osteopenia  Falls- none Fractures- none  Supplements-vit D Exercise -good /at gym/classes   Advance directive- up to date  Cognitive function addressed- see scanned forms- and if abnormal then additional documentation follows.   No problems  Handles her own affairs  Able to stay organized   PMH and SH  reviewed  Meds, vitals, and allergies reviewed.   ROS: See HPI.  Otherwise negative.    Weight : Wt Readings from Last 3 Encounters:  04/05/21 150 lb 9 oz (68.3 kg)  04/03/20 146 lb 9 oz (66.5 kg)  04/01/19 147 lb 7 oz (66.9 kg)   30.15 kg/m  Exercising regularly  New partner-cooks , gained some weight    Hearing/vision: Hearing Screening   '500Hz'$  '1000Hz'$  '2000Hz'$  '4000Hz'$   Right ear 40 0 40 40  Left ear 40 40 40 40  Vision Screening - Comments:: Eye exam done in March 2022 with Dr. Herbert Deaner  No hearing problems   Care team Rheannon Cerney-pcp Hecker-oph  HTN bp is stable today  No cp or palpitations or headaches or edema  No side effects to medicines  BP Readings from Last 3 Encounters:  04/05/21 136/70  04/03/20 135/70  04/01/19 134/80    Pulse Readings from Last 3 Encounters:  04/05/21 63  04/03/20 63  04/01/19 62   Takes hctz 25 mg daily  At home 120s/70s usually  Hyperlipidemia Lab Results  Component Value Date   CHOL 222 (H) 04/01/2020   HDL 46.50 04/01/2020   LDLCALC 141 (H) 04/01/2020   LDLDIRECT 141.0 03/28/2019   TRIG 172.0 (H) 04/01/2020   CHOLHDL 5 04/01/2020   Due for labs More bacon and sausage lately  Some shrimp Ice cream  May be up   Has been active  Exercising   Patient Active Problem List   Diagnosis Date Noted   Hypokalemia 04/01/2019   Essential hypertension 03/14/2017   Estrogen deficiency 03/09/2015   Routine  general medical examination at a health care facility 03/01/2015   Encounter for Medicare annual wellness exam 11/14/2012   Special screening for malignant neoplasms, colon 09/23/2011   Hyperlipidemia 06/24/2008   ALLERGIC RHINITIS 04/17/2007   Osteopenia 04/17/2007   MURMUR 04/17/2007   MIGRAINES, HX OF 04/17/2007   Past Medical History:  Diagnosis Date   Allergy    allergic rhinitis   Arthritis    Heart murmur    not treated   History of blood transfusion 1970   after childbirth   Hyperlipidemia    Osteopenia     Past Surgical History:  Procedure Laterality Date   TUBAL LIGATION  1979   Social History   Tobacco Use   Smoking status: Former    Types: Cigarettes    Quit date: 08/16/1967    Years since quitting: 53.6   Smokeless tobacco: Never  Vaping Use   Vaping Use: Never used  Substance Use Topics   Alcohol use: Yes    Alcohol/week: 1.0 standard drink    Types: 1 Glasses of wine per week    Comment: occ   Drug use: No   Family History  Problem Relation Age of Onset   Diabetes Mother    Heart disease Mother        MI   Hypertension Father        ??HTN   Hyperlipidemia Father    Cancer Father        pancreatic cancer   Pancreatic cancer Father    Hypertension Brother    Cancer Brother        pancreastic (suspect)    Cancer Paternal Grandmother        breast CA   Breast cancer Maternal Grandmother    Colon cancer Neg Hx    Stomach cancer Neg Hx    No Known Allergies Current Outpatient Medications on File Prior to Visit  Medication Sig Dispense Refill   Cholecalciferol (VITAMIN D3) 2000 units TABS Take 1 tablet by mouth daily.     ibuprofen (ADVIL,MOTRIN) 200 MG tablet Take 200 mg by mouth every 6 (six) hours as needed.     Multiple Vitamin (MULTIVITAMIN) capsule Take 1 capsule by mouth daily.     Multiple Vitamins-Minerals (PRESERVISION AREDS PO) Take 1 tablet by mouth 2 (two) times daily.     No current facility-administered medications on file prior to visit.     Review of Systems  Constitutional:  Negative for activity change, appetite change, fatigue, fever and unexpected weight change.  HENT:  Negative for congestion, ear pain, rhinorrhea, sinus pressure and sore throat.   Eyes:  Negative for pain, redness and visual disturbance.  Respiratory:  Negative for cough, shortness of breath and wheezing.   Cardiovascular:  Negative for chest pain and palpitations.  Gastrointestinal:  Negative for abdominal pain, blood in stool, constipation and diarrhea.  Endocrine:  Negative for polydipsia and polyuria.  Genitourinary:  Negative for dysuria, frequency and urgency.  Musculoskeletal:  Negative for arthralgias, back pain and myalgias.  Skin:  Negative for pallor and rash.  Allergic/Immunologic: Negative for environmental allergies.  Neurological:  Negative for dizziness, syncope and headaches.  Hematological:  Negative for adenopathy. Does not bruise/bleed easily.  Psychiatric/Behavioral:  Negative for decreased concentration and dysphoric mood. The patient is not nervous/anxious.       Objective:   Physical Exam Constitutional:      General: She is not in acute distress.    Appearance: Normal appearance. She is well-developed. She  is obese. She is not ill-appearing or diaphoretic.  HENT:     Head: Normocephalic and atraumatic.     Right Ear: Tympanic membrane, ear canal and external ear normal.     Left Ear: Tympanic membrane, ear canal and external ear normal.     Nose: Nose normal. No congestion.     Mouth/Throat:     Mouth: Mucous membranes are moist.     Pharynx: Oropharynx is clear. No posterior oropharyngeal erythema.  Eyes:     General: No scleral icterus.    Extraocular Movements: Extraocular movements intact.     Conjunctiva/sclera: Conjunctivae normal.     Pupils: Pupils are equal, round, and reactive to light.  Neck:     Thyroid: No thyromegaly.     Vascular: No carotid bruit or JVD.  Cardiovascular:     Rate and Rhythm: Normal rate and regular rhythm.     Pulses: Normal pulses.     Heart sounds: Normal heart sounds.    No gallop.  Pulmonary:     Effort: Pulmonary effort is normal. No respiratory distress.     Breath sounds: Normal breath sounds. No wheezing.     Comments: Good air exch Chest:     Chest wall: No tenderness.  Abdominal:     General: Bowel sounds are normal. There is no distension or abdominal bruit.     Palpations: Abdomen is soft. There is no mass.     Tenderness: There is no abdominal tenderness.      Hernia: No hernia is present.  Genitourinary:    Comments: Breast exam: No mass, nodules, thickening, tenderness, bulging, retraction, inflamation, nipple discharge or skin changes noted.  No axillary or clavicular LA.     Musculoskeletal:        General: No tenderness. Normal range of motion.     Cervical back: Normal range of motion and neck supple. No rigidity. No muscular tenderness.     Right lower leg: No edema.     Left lower leg: No edema.     Comments: No kyphosis   Lymphadenopathy:     Cervical: No cervical adenopathy.  Skin:    General: Skin is warm and dry.     Coloration: Skin is not pale.     Findings: No erythema or rash.     Comments: Solar lentigines diffusely   Neurological:     Mental Status: She is alert. Mental status is at baseline.     Cranial Nerves: No cranial nerve deficit.     Motor: No abnormal muscle tone.     Coordination: Coordination normal.     Gait: Gait normal.     Deep Tendon Reflexes: Reflexes are normal and symmetric. Reflexes normal.  Psychiatric:        Mood and Affect: Mood normal.        Cognition and Memory: Cognition and memory normal.     Comments: Mentally sharp  Problem List Items Addressed This Visit       Cardiovascular and Mediastinum   Essential hypertension    bp in fair control at this time  BP Readings from Last 1 Encounters:  04/05/21 136/70  No changes needed Most recent labs reviewed  Disc lifstyle change with low sodium diet and exercise  Plan to continue hctz 25 mg daily Labs ordered today      Relevant Medications   hydrochlorothiazide (HYDRODIURIL) 25 MG tablet   Other Relevant Orders   CBC with Differential/Platelet   Comprehensive metabolic panel  Lipid panel   TSH     Musculoskeletal and Integument   Osteopenia     Other   Hyperlipidemia    Disc goals for lipids and reasons to control them Rev last labs with pt Rev low sat fat diet in detail  Lipids ordered today  Has eaten more sat fat   Unsure if she would ever try medication for cholesterol       Relevant Medications   hydrochlorothiazide (HYDRODIURIL) 25 MG tablet   Encounter for Medicare annual wellness exam - Primary    Reviewed health habits including diet and exercise and skin cancer prevention Reviewed appropriate screening tests for age  Also reviewed health mt list, fam hx and immunization status , as well as social and family history   See HPI Labs ordered  Colonoscopy is due in march-pt aware Mammogram utd  imms utd dexa ordered-pt will schedule  Taking vit D and no falls/fractures  Advance directive utd No cognitive concerns  Considering pap since pt has new partner (monogamous) - she plans to check on ins coverage for that  Hearing screen reassuring  Vision/eye care utd        Routine general medical examination at a health care facility    Reviewed health habits including diet and exercise and skin cancer prevention Reviewed appropriate screening tests for age  Also reviewed health mt list, fam hx and immunization status , as well as social and family history   See HPI Labs ordered  Colonoscopy is due in march-pt aware Mammogram utd  imms utd dexa ordered-pt will schedule  Taking vit D and no falls/fractures  Advance directive utd No cognitive concerns  Considering pap since pt has new partner (monogamous) - she plans to check on ins coverage for that  Hearing screen reassuring  Vision/eye care utd        Estrogen deficiency   Relevant Orders   DG Bone Density           Assessment & Plan:

## 2021-04-05 NOTE — Assessment & Plan Note (Signed)
bp in fair control at this time  BP Readings from Last 1 Encounters:  04/05/21 136/70   No changes needed Most recent labs reviewed  Disc lifstyle change with low sodium diet and exercise  Plan to continue hctz 25 mg daily Labs ordered today

## 2021-04-05 NOTE — Assessment & Plan Note (Signed)
Disc goals for lipids and reasons to control them Rev last labs with pt Rev low sat fat diet in detail  Lipids ordered today  Has eaten more sat fat  Unsure if she would ever try medication for cholesterol

## 2021-04-07 ENCOUNTER — Encounter: Payer: Self-pay | Admitting: *Deleted

## 2021-09-29 ENCOUNTER — Ambulatory Visit
Admission: RE | Admit: 2021-09-29 | Discharge: 2021-09-29 | Disposition: A | Discharge status: Home / Self Care | Payer: Medicare PPO | Source: Ambulatory Visit | Attending: Family Medicine | Admitting: Family Medicine

## 2021-09-29 ENCOUNTER — Telehealth: Payer: Self-pay | Admitting: Family Medicine

## 2021-09-29 DIAGNOSIS — Z780 Asymptomatic menopausal state: Secondary | ICD-10-CM | POA: Diagnosis not present

## 2021-09-29 DIAGNOSIS — M8589 Other specified disorders of bone density and structure, multiple sites: Secondary | ICD-10-CM | POA: Diagnosis not present

## 2021-09-29 DIAGNOSIS — E2839 Other primary ovarian failure: Secondary | ICD-10-CM

## 2021-09-29 NOTE — Telephone Encounter (Signed)
LVM for pt to rtn my call to schedule AWV with [facility]. Please schedule AWV if pt call the office.

## 2021-10-25 ENCOUNTER — Encounter: Payer: Self-pay | Admitting: Gastroenterology

## 2021-11-03 ENCOUNTER — Encounter: Payer: Self-pay | Admitting: Gastroenterology

## 2021-11-11 ENCOUNTER — Encounter: Payer: Self-pay | Admitting: Gastroenterology

## 2021-12-03 ENCOUNTER — Other Ambulatory Visit: Payer: Self-pay

## 2021-12-03 ENCOUNTER — Ambulatory Visit (AMBULATORY_SURGERY_CENTER): Payer: Medicare PPO

## 2021-12-03 VITALS — Ht 59.0 in | Wt 150.0 lb

## 2021-12-03 DIAGNOSIS — Z1211 Encounter for screening for malignant neoplasm of colon: Secondary | ICD-10-CM

## 2021-12-03 NOTE — Progress Notes (Signed)

## 2021-12-08 ENCOUNTER — Encounter: Payer: Self-pay | Admitting: Gastroenterology

## 2021-12-09 DIAGNOSIS — H35033 Hypertensive retinopathy, bilateral: Secondary | ICD-10-CM | POA: Diagnosis not present

## 2021-12-09 DIAGNOSIS — H2513 Age-related nuclear cataract, bilateral: Secondary | ICD-10-CM | POA: Diagnosis not present

## 2021-12-09 DIAGNOSIS — H25013 Cortical age-related cataract, bilateral: Secondary | ICD-10-CM | POA: Diagnosis not present

## 2021-12-09 DIAGNOSIS — H524 Presbyopia: Secondary | ICD-10-CM | POA: Diagnosis not present

## 2021-12-09 DIAGNOSIS — H35373 Puckering of macula, bilateral: Secondary | ICD-10-CM | POA: Diagnosis not present

## 2021-12-09 DIAGNOSIS — H353132 Nonexudative age-related macular degeneration, bilateral, intermediate dry stage: Secondary | ICD-10-CM | POA: Diagnosis not present

## 2021-12-17 ENCOUNTER — Encounter: Payer: Self-pay | Admitting: Gastroenterology

## 2021-12-17 ENCOUNTER — Ambulatory Visit (AMBULATORY_SURGERY_CENTER): Payer: Medicare PPO | Admitting: Gastroenterology

## 2021-12-17 VITALS — BP 134/52 | HR 58 | Temp 97.5°F | Resp 13 | Ht 59.0 in | Wt 150.0 lb

## 2021-12-17 DIAGNOSIS — D120 Benign neoplasm of cecum: Secondary | ICD-10-CM

## 2021-12-17 DIAGNOSIS — Z1211 Encounter for screening for malignant neoplasm of colon: Secondary | ICD-10-CM

## 2021-12-17 DIAGNOSIS — K635 Polyp of colon: Secondary | ICD-10-CM | POA: Diagnosis not present

## 2021-12-17 DIAGNOSIS — E785 Hyperlipidemia, unspecified: Secondary | ICD-10-CM | POA: Diagnosis not present

## 2021-12-17 DIAGNOSIS — I10 Essential (primary) hypertension: Secondary | ICD-10-CM | POA: Diagnosis not present

## 2021-12-17 MED ORDER — SODIUM CHLORIDE 0.9 % IV SOLN: 500.0000 mL | Freq: Once | INTRAVENOUS | Status: DC

## 2021-12-17 NOTE — Progress Notes (Signed)
A and O x3. Report to RN. Tolerated MAC anesthesia well. 

## 2021-12-17 NOTE — Patient Instructions (Signed)
Handouts Provided:  Polyps  YOU HAD AN ENDOSCOPIC PROCEDURE TODAY AT THE [facility]:   Refer to the procedure report that was given to you for any specific questions about what was found during the examination.  If the procedure report does not answer your questions, please call your gastroenterologist to clarify.  If you requested that your care partner not be given the details of your procedure findings, then the procedure report has been included in a sealed envelope for you to review at your convenience later.  YOU SHOULD EXPECT: Some feelings of bloating in the abdomen. Passage of more gas than usual.  Walking can help get rid of the air that was put into your GI tract during the procedure and reduce the bloating. If you had a lower endoscopy (such as a colonoscopy or flexible sigmoidoscopy) you may notice spotting of blood in your stool or on the toilet paper. If you underwent a bowel prep for your procedure, you may not have a normal bowel movement for a few days.  Please Note:  You might notice some irritation and congestion in your nose or some drainage.  This is from the oxygen used during your procedure.  There is no need for concern and it should clear up in a day or so.  SYMPTOMS TO REPORT IMMEDIATELY:   Following lower endoscopy (colonoscopy or flexible sigmoidoscopy):  Excessive amounts of blood in the stool  Significant tenderness or worsening of abdominal pains  Swelling of the abdomen that is new, acute  Fever of 100F or higher  For urgent or emergent issues, a gastroenterologist can be reached at any hour by calling [phone]. Do not use MyChart messaging for urgent concerns.    DIET:  We do recommend a small meal at first, but then you may proceed to your regular diet.  Drink plenty of fluids but you should avoid alcoholic beverages for 24 hours.  ACTIVITY:  You should plan to take it easy for the rest of today and you should NOT DRIVE or use heavy  machinery until tomorrow (because of the sedation medicines used during the test).    FOLLOW UP: Our staff will call the number listed on your records 48-72 hours following your procedure to check on you and address any questions or concerns that you may have regarding the information given to you following your procedure. If we do not reach you, we will leave a message.  We will attempt to reach you two times.  During this call, we will ask if you have developed any symptoms of COVID 19. If you develop any symptoms (ie: fever, flu-like symptoms, shortness of breath, cough etc.) before then, please call [phone].  If you test positive for Covid 19 in the 2 weeks post procedure, please call and report this information to us.    If any biopsies were taken you will be contacted by phone or by letter within the next 1-3 weeks.  Please call us at [phone] if you have not heard about the biopsies in 3 weeks.    SIGNATURES/CONFIDENTIALITY: You and/or your care partner have signed paperwork which will be entered into your electronic medical record.  These signatures attest to the fact that that the information above on your After Visit Summary has been reviewed and is understood.  Full responsibility of the confidentiality of this discharge information lies with you and/or your care-partner.  

## 2021-12-17 NOTE — Progress Notes (Signed)
Pt. Reports no change in her medical or surgical history since her pre-visit 12/03/2021. ?

## 2021-12-17 NOTE — Progress Notes (Signed)
Called to room to assist during endoscopic procedure.  Patient ID and intended procedure confirmed with present staff. Received instructions for my participation in the procedure from the performing physician.  

## 2021-12-17 NOTE — Op Note (Signed)
[place] ?Patient Name: Melanie Marshall ?Procedure Date: 12/17/2021 8:23 AM ?MRN: [id number] ?Endoscopist: Esdras Delair E. Candis Schatz , MD ?Age: 76 ?Referring MD:  ?Date of Birth: Jan 26, 1946 ?Gender: Female ?Account #: [id number] ?Procedure:                Colonoscopy ?Indications:              Screening for colorectal malignant neoplasm (last  ?                          colonoscopy was 10 years ago) ?Medicines:                Monitored Anesthesia Care ?Procedure:                Pre-Anesthesia Assessment: ?                          - Prior to the procedure, a History and Physical  ?                          was performed, and patient medications and  ?                          allergies were reviewed. The patient's tolerance of  ?                          previous anesthesia was also reviewed. The risks  ?                          and benefits of the procedure and the sedation  ?                          options and risks were discussed with the patient.  ?                          All questions were answered, and informed consent  ?                          was obtained. Prior Anticoagulants: The patient has  ?                          taken no previous anticoagulant or antiplatelet  ?                          agents. ASA Grade Assessment: II - A patient with  ?                          mild systemic disease. After reviewing the risks  ?                          and benefits, the patient was deemed in  ?                          satisfactory condition to undergo the procedure. ?  After obtaining informed consent, the colonoscope  ?                          was passed under direct vision. Throughout the  ?                          procedure, the patient's blood pressure, pulse, and  ?                          oxygen saturations were monitored continuously. The  ?                          CF [id number] #[id number] was introduced through the anus  ?                          and advanced to the the  cecum, identified by the  ?                          appendiceal orifice. The colonoscopy was performed  ?                          with moderate difficulty due to significant looping  ?                          and a tortuous colon. Successful completion of the  ?                          procedure was aided by using manual pressure. The  ?                          patient tolerated the procedure well. The quality  ?                          of the bowel preparation was adequate. The  ?                          ileocecal valve, appendiceal orifice, and rectum  ?                          were photographed. The bowel preparation used was  ?                          Miralax via split dose instruction. ?Scope In: 8:37:06 AM ?Scope Out: 9:06:39 AM ?Scope Withdrawal Time: 0 hours 15 minutes 24 seconds  ?Total Procedure Duration: 0 hours 29 minutes 33 seconds  ?Findings:                 The perianal and digital rectal examinations were  ?                          normal. Pertinent negatives include normal  ?                          sphincter tone and no palpable rectal  lesions. ?                          A 2 mm polyp was found in the cecum. The polyp was  ?                          sessile. The polyp was removed with a cold biopsy  ?                          forceps. Resection and retrieval were complete. ?                          Multiple small and large-mouthed diverticula were  ?                          found in the sigmoid colon, descending colon and  ?                          transverse colon. ?                          The exam was otherwise normal throughout the  ?                          examined colon. ?                          Internal hemorrhoids were found during  ?                          retroflexion. The hemorrhoids were Grade I  ?                          (internal hemorrhoids that do not prolapse). ?                          No additional abnormalities were found on  ?                           retroflexion. ?Complications:            No immediate complications. ?Estimated Blood Loss:     Estimated blood loss was minimal. ?Impression:               - One 2 mm polyp in the cecum, removed with a cold  ?                          biopsy forceps. Resected and retrieved. ?                          - Diverticulosis in the sigmoid colon, in the  ?                          descending colon and in the transverse colon. ?                          -  Internal hemorrhoids. ?Recommendation:           - Patient has a contact number available for  ?                          emergencies. The signs and symptoms of potential  ?                          delayed complications were discussed with the  ?                          patient. Return to normal activities tomorrow.  ?                          Written discharge instructions were provided to the  ?                          patient. ?                          - Resume previous diet. ?                          - Continue present medications. ?                          - Await pathology results. ?                          - Recommend against further colon cancer screening  ?                          given age and lack of significant polyps. ?Angad Nabers E. Candis Schatz, MD ?12/17/2021 9:11:31 AM ?This report has been signed electronically. ?

## 2021-12-17 NOTE — Progress Notes (Signed)
[facility] History and Physical ? ? ?Primary Care Physician:  Tower, Wynelle Fanny, MD ? ? ?Reason for Procedure:   Colon cancer screening ? ?Plan:    Screening colonoscopy ? ? ? ? ?HPI: Melanie Marshall is a 76 y.o. female undergoing average risk screening colonoscopy.  She has no family history of colon cancer and no chronic GI symptoms. Her last colonoscopy was in 2013 and notable for left sided diverticulosis, otherwise unremarkable. ? ? ?Past Medical History:  ?Diagnosis Date  ? Allergy   ? allergic rhinitis  ? Arthritis   ? Heart murmur   ? not treated  ? History of blood transfusion 08/15/1968  ? after childbirth  ? Hyperlipidemia   ? Hypertension   ? Osteopenia   ? ? ?Past Surgical History:  ?Procedure Laterality Date  ? COLONOSCOPY    ? TUBAL LIGATION  08/15/1977  ? ? ?Prior to Admission medications   ?Medication Sig Start Date End Date Taking? Authorizing Provider  ?Cholecalciferol (VITAMIN D3) 2000 units TABS Take 1 tablet by mouth daily.   Yes [provider]  ?hydrochlorothiazide (HYDRODIURIL) 25 MG tablet Take 1 tablet (25 mg total) by mouth daily. 04/05/21  Yes Tower, Wynelle Fanny, MD  ?ibuprofen (ADVIL,MOTRIN) 200 MG tablet Take 200 mg by mouth every 6 (six) hours as needed.   Yes [provider]  ?Multiple Vitamin (MULTIVITAMIN) capsule Take 1 capsule by mouth daily.   Yes [provider]  ?Multiple Vitamins-Minerals (PRESERVISION AREDS PO) Take 1 tablet by mouth 2 (two) times daily.   Yes [provider]  ?potassium chloride (KLOR-CON) 10 MEQ tablet Take 1 tablet (10 mEq total) by mouth daily. 04/05/21  Yes Tower, Wynelle Fanny, MD  ? ? ?Current Outpatient Medications  ?Medication Sig Dispense Refill  ? Cholecalciferol (VITAMIN D3) 2000 units TABS Take 1 tablet by mouth daily.    ? hydrochlorothiazide (HYDRODIURIL) 25 MG tablet Take 1 tablet (25 mg total) by mouth daily. 90 tablet 3  ? ibuprofen (ADVIL,MOTRIN) 200 MG tablet Take 200 mg by mouth every 6 (six) hours as  needed.    ? Multiple Vitamin (MULTIVITAMIN) capsule Take 1 capsule by mouth daily.    ? Multiple Vitamins-Minerals (PRESERVISION AREDS PO) Take 1 tablet by mouth 2 (two) times daily.    ? potassium chloride (KLOR-CON) 10 MEQ tablet Take 1 tablet (10 mEq total) by mouth daily. 90 tablet 3  ? ?Current Facility-Administered Medications  ?Medication Dose Route Frequency Provider Last Rate Last Admin  ? 0.9 %  sodium chloride infusion  500 mL Intravenous Once Daryel November, MD      ? ? ?Allergies as of 12/17/2021  ? (No Known Allergies)  ? ? ?Family History  ?Problem Relation Age of Onset  ? Diabetes Mother   ? Heart disease Mother   ?     MI  ? Hypertension Father   ?     ??HTN  ? Hyperlipidemia Father   ? Pancreatic cancer Father   ? Hypertension Brother   ? Cancer Brother   ?     pancreastic (suspect)   ? Breast cancer Maternal Grandmother   ? Cancer Paternal Grandmother   ?     breast CA  ? Colon cancer Neg Hx   ? Stomach cancer Neg Hx   ? Colon polyps Neg Hx   ? Esophageal cancer Neg Hx   ? Rectal cancer Neg Hx   ? ? ?Social History  ? ?Socioeconomic History  ? Marital  status: Widowed  ?  Spouse name: Not on file  ? Number of children: Not on file  ? Years of education: Not on file  ? Highest education level: Not on file  ?Occupational History  ? Not on file  ?Tobacco Use  ? Smoking status: Former  ?  Types: Cigarettes  ?  Quit date: 08/16/1967  ?  Years since quitting: 54.3  ? Smokeless tobacco: Never  ?Vaping Use  ? Vaping Use: Never used  ?Substance and Sexual Activity  ? Alcohol use: Yes  ?  Alcohol/week: 1.0 standard drink  ?  Types: 1 Glasses of wine per week  ?  Comment: occ  ? Drug use: No  ? Sexual activity: Never  ?Other Topics Concern  ? Not on file  ?Social History Narrative  ? Not on file  ? ?Social Determinants of Health  ? ?Financial Resource Strain: Not on file  ?Food Insecurity: Not on file  ?Transportation Needs: Not on file  ?Physical Activity: Not on file  ?Stress: Not on file  ?Social  Connections: Not on file  ?Intimate Partner Violence: Not on file  ? ? ?Review of Systems: ? ?All other review of systems negative except as mentioned in the HPI. ? ?Physical Exam: ?Vital signs ?BP (!) 147/66   Pulse 60   Temp (!) 97.5 ?F (36.4 ?C)   Ht '4\' 11"'$  (1.499 m)   Wt 150 lb (68 kg)   SpO2 99%   BMI 30.30 kg/m?  ? ?General:   Alert,  Well-developed, well-nourished, pleasant and cooperative in NAD ?Airway:  Mallampati 2 ?Lungs:  Clear throughout to auscultation.   ?Heart:  Regular rate and rhythm; no murmurs, clicks, rubs,  or gallops. ?Abdomen:  Soft, nontender and nondistended. Normal bowel sounds.   ?Neuro/Psych:  Normal mood and affect. A and O x 3 ? ? ?Abrar Bilton E. Candis Schatz, MD ?[facility] ? ?

## 2021-12-21 ENCOUNTER — Telehealth: Payer: Self-pay | Admitting: *Deleted

## 2021-12-21 NOTE — Telephone Encounter (Signed)
?  Follow up Call- ? ? ?  12/17/2021  ?  7:39 AM  ?Call back number  ?Post procedure Call Back phone  # [phone]  ?Permission to leave phone message Yes  ?  ? ?Patient questions: ? ?Do you have a fever, pain , or abdominal swelling? No. ?Pain Score  0 * ? ?Have you tolerated food without any problems? Yes.   ? ?Have you been able to return to your normal activities? Yes.   ? ?Do you have any questions about your discharge instructions: ?Diet   No. ?Medications  No. ?Follow up visit  No. ? ?Do you have questions or concerns about your Care? No. ? ?Actions: ?* If pain score is 4 or above: ?No action needed, pain <4. ? ? ?

## 2021-12-28 NOTE — Progress Notes (Signed)
Ms. Rosado,  ?Good news: the polyp that I removed during your recent examination were NOT precancerous.  Given your age and lack of high risk polyps, I recommend against any further colon cancer screening for you. ? ? ?

## 2022-03-17 ENCOUNTER — Other Ambulatory Visit: Payer: Self-pay | Admitting: Family Medicine

## 2022-03-17 DIAGNOSIS — Z1231 Encounter for screening mammogram for malignant neoplasm of breast: Secondary | ICD-10-CM

## 2022-03-29 ENCOUNTER — Telehealth: Payer: Self-pay | Admitting: Family Medicine

## 2022-03-29 DIAGNOSIS — I10 Essential (primary) hypertension: Secondary | ICD-10-CM

## 2022-03-29 DIAGNOSIS — E7800 Pure hypercholesterolemia, unspecified: Secondary | ICD-10-CM

## 2022-03-29 NOTE — Telephone Encounter (Signed)
-----   Message from Ellamae Sia sent at 03/14/2022  3:48 PM EDT ----- Regarding: Lab orders for Wednesday, 8.16.23 Patient is scheduled for CPX labs, please order future labs, Thanks , Karna Christmas

## 2022-03-30 ENCOUNTER — Other Ambulatory Visit (INDEPENDENT_AMBULATORY_CARE_PROVIDER_SITE_OTHER): Payer: Medicare PPO

## 2022-03-30 DIAGNOSIS — I10 Essential (primary) hypertension: Secondary | ICD-10-CM

## 2022-03-30 DIAGNOSIS — E7800 Pure hypercholesterolemia, unspecified: Secondary | ICD-10-CM

## 2022-03-30 LAB — CBC WITH DIFFERENTIAL/PLATELET
Basophils Absolute: 0 10*3/uL (ref 0.0–0.1)
Basophils Relative: 0.4 % (ref 0.0–3.0)
Eosinophils Absolute: 0.1 10*3/uL (ref 0.0–0.7)
Eosinophils Relative: 2.6 % (ref 0.0–5.0)
HCT: 37 % (ref 36.0–46.0)
Hemoglobin: 12.4 g/dL (ref 12.0–15.0)
Lymphocytes Relative: 38.4 % (ref 12.0–46.0)
Lymphs Abs: 2 10*3/uL (ref 0.7–4.0)
MCHC: 33.5 g/dL (ref 30.0–36.0)
MCV: 90.6 fl (ref 78.0–100.0)
Monocytes Absolute: 0.6 10*3/uL (ref 0.1–1.0)
Monocytes Relative: 11.2 % (ref 3.0–12.0)
Neutro Abs: 2.5 10*3/uL (ref 1.4–7.7)
Neutrophils Relative %: 47.4 % (ref 43.0–77.0)
Platelets: 316 10*3/uL (ref 150.0–400.0)
RBC: 4.09 Mil/uL (ref 3.87–5.11)
RDW: 13.4 % (ref 11.5–15.5)
WBC: 5.2 10*3/uL (ref 4.0–10.5)

## 2022-03-30 LAB — COMPREHENSIVE METABOLIC PANEL
ALT: 17 U/L (ref 0–35)
AST: 18 U/L (ref 0–37)
Albumin: 4.4 g/dL (ref 3.5–5.2)
Alkaline Phosphatase: 47 U/L (ref 39–117)
BUN: 20 mg/dL (ref 6–23)
CO2: 28 mEq/L (ref 19–32)
Calcium: 9.2 mg/dL (ref 8.4–10.5)
Chloride: 99 mEq/L (ref 96–112)
Creatinine, Ser: 1.05 mg/dL (ref 0.40–1.20)
GFR: 51.91 mL/min — ABNORMAL LOW (ref >60.00)
Glucose, Bld: 92 mg/dL (ref 70–99)
Potassium: 4 mEq/L (ref 3.5–5.1)
Sodium: 136 mEq/L (ref 135–145)
Total Bilirubin: 0.6 mg/dL (ref 0.2–1.2)
Total Protein: 7 g/dL (ref 6.0–8.3)

## 2022-03-30 LAB — LIPID PANEL
Cholesterol: 229 mg/dL — ABNORMAL HIGH (ref 0–200)
HDL: 43.9 mg/dL (ref >39.00)
NonHDL: 184.79
Total CHOL/HDL Ratio: 5
Triglycerides: 242 mg/dL — ABNORMAL HIGH (ref 0.0–149.0)
VLDL: 48.4 mg/dL — ABNORMAL HIGH (ref 0.0–40.0)

## 2022-03-30 LAB — TSH: TSH: 1.82 u[IU]/mL (ref 0.35–5.50)

## 2022-03-30 LAB — LDL CHOLESTEROL, DIRECT: Direct LDL: 145 mg/dL

## 2022-04-05 ENCOUNTER — Ambulatory Visit
Admission: RE | Admit: 2022-04-05 | Discharge: 2022-04-05 | Disposition: A | Discharge status: Home / Self Care | Payer: Medicare PPO | Source: Ambulatory Visit | Attending: Family Medicine | Admitting: Family Medicine

## 2022-04-05 DIAGNOSIS — Z1231 Encounter for screening mammogram for malignant neoplasm of breast: Secondary | ICD-10-CM | POA: Diagnosis not present

## 2022-04-06 ENCOUNTER — Ambulatory Visit (INDEPENDENT_AMBULATORY_CARE_PROVIDER_SITE_OTHER): Payer: Medicare PPO | Admitting: Family Medicine

## 2022-04-06 ENCOUNTER — Encounter: Payer: Self-pay | Admitting: Family Medicine

## 2022-04-06 ENCOUNTER — Ambulatory Visit: Payer: Medicare PPO

## 2022-04-06 VITALS — BP 122/78 | HR 77 | Ht 59.0 in | Wt 158.0 lb

## 2022-04-06 DIAGNOSIS — E7800 Pure hypercholesterolemia, unspecified: Secondary | ICD-10-CM | POA: Diagnosis not present

## 2022-04-06 DIAGNOSIS — E876 Hypokalemia: Secondary | ICD-10-CM | POA: Diagnosis not present

## 2022-04-06 DIAGNOSIS — Z0000 Encounter for general adult medical examination without abnormal findings: Secondary | ICD-10-CM

## 2022-04-06 DIAGNOSIS — I10 Essential (primary) hypertension: Secondary | ICD-10-CM

## 2022-04-06 DIAGNOSIS — M8589 Other specified disorders of bone density and structure, multiple sites: Secondary | ICD-10-CM | POA: Diagnosis not present

## 2022-04-06 DIAGNOSIS — M25551 Pain in right hip: Secondary | ICD-10-CM | POA: Insufficient documentation

## 2022-04-06 MED ORDER — POTASSIUM CHLORIDE ER 10 MEQ PO TBCR
10.0000 meq | EXTENDED_RELEASE_TABLET | Freq: Every day | ORAL | 3 refills | Status: DC
Start: 2022-04-06 — End: 2023-04-03

## 2022-04-06 MED ORDER — HYDROCHLOROTHIAZIDE 25 MG PO TABS: 25.0000 mg | ORAL_TABLET | Freq: Every day | ORAL | 3 refills | Status: DC

## 2022-04-06 MED ORDER — ROSUVASTATIN CALCIUM 5 MG PO TABS: 5.0000 mg | ORAL_TABLET | Freq: Every day | ORAL | 3 refills | Status: DC

## 2022-04-06 NOTE — Assessment & Plan Note (Signed)
Fairly stable on dexa 09/2021 No fractures (one fall out of bed) in general good balance  Taking D3 Good exercise  Will re check dexa  2y Disc fall prev

## 2022-04-06 NOTE — Assessment & Plan Note (Signed)
With hctz   Taking 10 meq K daily  Level of 4-stable

## 2022-04-06 NOTE — Progress Notes (Signed)
Subjective:    Patient ID: Melanie Marshall, female    DOB: 10/28/1945, 76 y.o.   MRN: [id number]  HPI Here for health maintenance exam and to review chronic medical problems    Wt Readings from Last 3 Encounters:  04/06/22 158 lb (71.7 kg)  12/17/21 150 lb (68 kg)  12/03/21 150 lb (68 kg)   31.91 kg/m  Is doing well  Has a new partner for over a year  Very happy    Has gained some weight  Self care if fair  [facility] and [facility]  Some outdoor time / has to walk her dog      Immunization History  Administered Date(s) Administered   Fluad Quad(high Dose 65+) 04/25/2019   Hepatitis A 06/24/2008   Influenza Split 06/10/2011   Influenza Whole 07/23/2010   Influenza, High Dose Seasonal PF 05/08/2015, 05/16/2016, 05/11/2020, 06/10/2021   Influenza,inj,Quad PF,6+ Mos 04/18/2018   Influenza-Unspecified 05/15/2013, 05/16/2014, 05/08/2017   PFIZER Comirnaty(Gray Top)Covid-19 Tri-Sucrose Vaccine 12/14/2020   PFIZER(Purple Top)SARS-COV-2 Vaccination 10/18/2019, 11/08/2019, 05/11/2020   Pfizer Covid-19 Vaccine Bivalent Booster 42yr & up 06/10/2021   Pneumococcal Conjugate-13 12/06/2013   Pneumococcal Polysaccharide-23 09/23/2011   Td 05/18/2007   Tdap 10/31/2017   Zoster Recombinat (Shingrix) 11/09/2020, 03/17/2021   Zoster, Live 06/29/2012   Health Maintenance Due  Topic Date Due   COVID-19 Vaccine (6 - Pfizer series) 10/11/2021   MAMMOGRAM  04/01/2022   INFLUENZA VACCINE  03/15/2022   Mammogram had it yesterday - [facility]  Pending the reading   Self breast exam : no lumps   No gyn problems  Declines std testing    Colonoscopy 12/2021 Up to date - no recall   Dexa  09/2021- stable osteopenia   Falls- fell out the bed Monday am (was asleep and too close to the edge) -brulsed her shins (doing ok now)  Does not routinely fall  Fractures: none  Supplements -vitamin D3 Exercise : [facility] and weights and  walking   HTN bp is stable today  No cp or palpitations or headaches or edema  No side effects to medicines  BP Readings from Last 3 Encounters:  04/06/22 122/78  12/17/21 (!) 134/52  04/05/21 136/70     Pulse Readings from Last 3 Encounters:  04/06/22 77  12/17/21 (!) 58  04/05/21 63    Hctz 25 mg daily  Klor con 10 meq daily   Lab Results  Component Value Date   CREATININE 1.05 03/30/2022   BUN 20 03/30/2022   NA 136 03/30/2022   K 4.0 03/30/2022   CL 99 03/30/2022   CO2 28 03/30/2022    Hyperlipidemia Lab Results  Component Value Date   CHOL 229 (H) 03/30/2022   CHOL 219 (H) 04/05/2021   CHOL 222 (H) 04/01/2020   Lab Results  Component Value Date   HDL 43.90 03/30/2022   HDL 52.70 04/05/2021   HDL 46.50 04/01/2020   Lab Results  Component Value Date   LDLCALC 139 (H) 04/05/2021   LDLCALC 141 (H) 04/01/2020   LDLCALC 140 (H) 03/26/2018   Lab Results  Component Value Date   TRIG 242.0 (H) 03/30/2022   TRIG 132.0 04/05/2021   TRIG 172.0 (H) 04/01/2020   Lab Results  Component Value Date   CHOLHDL 5 03/30/2022   CHOLHDL 4 04/05/2021   CHOLHDL 5 04/01/2020   Lab Results  Component Value Date  LDLDIRECT 145.0 03/30/2022   LDLDIRECT 141.0 03/28/2019   LDLDIRECT 138.8 02/21/2013   Diet controlled  HDL is down  Exercise is the same  Her partner does the cooking-hard to change diet    Does eat some fish   Occ sweets-not a lot   The 10-year ASCVD risk score (Arnett DK, et al., 2019) is: 19.5%   Values used to calculate the score:     Age: 64 years     Sex: Female     Is Non-Hispanic African American: No     Diabetic: No     Tobacco smoker: No     Systolic Blood Pressure: 633 mmHg     Is BP treated: Yes     HDL Cholesterol: 43.9 mg/dL     Total Cholesterol: 229 mg/dL  Mother had heart disease   Lab Results  Component Value Date   WBC 5.2 03/30/2022   HGB 12.4 03/30/2022   HCT 37.0 03/30/2022   MCV 90.6 03/30/2022   PLT 316.0  03/30/2022  . Lab Results  Component Value Date   TSH 1.82 03/30/2022    Patient Active Problem List   Diagnosis Date Noted   Hypokalemia 04/01/2019   Essential hypertension 03/14/2017   Estrogen deficiency 03/09/2015   Routine general medical examination at a health care facility 03/01/2015   Encounter for Medicare annual wellness exam 11/14/2012   Hyperlipidemia 06/24/2008   ALLERGIC RHINITIS 04/17/2007   Osteopenia 04/17/2007   MURMUR 04/17/2007   MIGRAINES, HX OF 04/17/2007   Past Medical History:  Diagnosis Date   Allergy    allergic rhinitis   Arthritis    Heart murmur    not treated   History of blood transfusion 08/15/1968   after childbirth   Hyperlipidemia    Hypertension    Osteopenia    Past Surgical History:  Procedure Laterality Date   COLONOSCOPY     TUBAL LIGATION  08/15/1977   Social History   Tobacco Use   Smoking status: Former    Types: Cigarettes    Quit date: 08/16/1967    Years since quitting: 54.6   Smokeless tobacco: Never  Vaping Use   Vaping Use: Never used  Substance Use Topics   Alcohol use: Yes    Alcohol/week: 1.0 standard drink of alcohol    Types: 1 Glasses of wine per week    Comment: occ   Drug use: No   Family History  Problem Relation Age of Onset   Diabetes Mother    Heart disease Mother        MI   Hypertension Father        ??HTN   Hyperlipidemia Father    Pancreatic cancer Father    Hypertension Brother    Cancer Brother        pancreastic (suspect)    Breast cancer Maternal Grandmother    Cancer Paternal Grandmother        breast CA   Colon cancer Neg Hx    Stomach cancer Neg Hx    Colon polyps Neg Hx    Esophageal cancer Neg Hx    Rectal cancer Neg Hx    No Known Allergies Current Outpatient Medications on File Prior to Visit  Medication Sig Dispense Refill   Cholecalciferol (VITAMIN D3) 2000 units TABS Take 1 tablet by mouth daily.     ibuprofen (ADVIL,MOTRIN) 200 MG tablet Take 200 mg by mouth  every 6 (six) hours as needed.     Multiple Vitamin (  MULTIVITAMIN) capsule Take 1 capsule by mouth daily.     Multiple Vitamins-Minerals (PRESERVISION AREDS PO) Take 1 tablet by mouth 2 (two) times daily.     No current facility-administered medications on file prior to visit.     Review of Systems  Constitutional:  Negative for activity change, appetite change, fatigue, fever and unexpected weight change.  HENT:  Negative for congestion, ear pain, rhinorrhea, sinus pressure and sore throat.   Eyes:  Negative for pain, redness and visual disturbance.  Respiratory:  Negative for cough, shortness of breath and wheezing.   Cardiovascular:  Negative for chest pain and palpitations.  Gastrointestinal:  Negative for abdominal pain, blood in stool, constipation and diarrhea.  Endocrine: Negative for polydipsia and polyuria.  Genitourinary:  Negative for dysuria, frequency and urgency.  Musculoskeletal:  Negative for arthralgias, back pain and myalgias.       Some pain in L hip area when she lies on it (not groin)  Tried pillow between knees-cannot keep it there  No neuro changes   Skin:  Negative for pallor and rash.  Allergic/Immunologic: Negative for environmental allergies.  Neurological:  Negative for dizziness, syncope and headaches.  Hematological:  Negative for adenopathy. Does not bruise/bleed easily.  Psychiatric/Behavioral:  Negative for decreased concentration and dysphoric mood. The patient is not nervous/anxious.        Objective:   Physical Exam Constitutional:      General: She is not in acute distress.    Appearance: Normal appearance. She is well-developed. She is obese. She is not ill-appearing or diaphoretic.  HENT:     Head: Normocephalic and atraumatic.     Right Ear: Tympanic membrane, ear canal and external ear normal.     Left Ear: Tympanic membrane, ear canal and external ear normal.     Nose: Nose normal. No congestion.     Mouth/Throat:     Mouth: Mucous  membranes are moist.     Pharynx: Oropharynx is clear. No posterior oropharyngeal erythema.  Eyes:     General: No scleral icterus.    Extraocular Movements: Extraocular movements intact.     Conjunctiva/sclera: Conjunctivae normal.     Pupils: Pupils are equal, round, and reactive to light.  Neck:     Thyroid: No thyromegaly.     Vascular: No carotid bruit or JVD.  Cardiovascular:     Rate and Rhythm: Normal rate and regular rhythm.     Pulses: Normal pulses.     Heart sounds: Normal heart sounds.     No gallop.  Pulmonary:     Effort: Pulmonary effort is normal. No respiratory distress.     Breath sounds: Normal breath sounds. No wheezing.     Comments: Good air exch Chest:     Chest wall: No tenderness.  Abdominal:     General: Bowel sounds are normal. There is no distension or abdominal bruit.     Palpations: Abdomen is soft. There is no mass.     Tenderness: There is no abdominal tenderness.     Hernia: No hernia is present.  Genitourinary:    Comments: Breast exam: No mass, nodules, thickening, tenderness, bulging, retraction, inflamation, nipple discharge or skin changes noted.  No axillary or clavicular LA.     Musculoskeletal:        General: No tenderness. Normal range of motion.     Cervical back: Normal range of motion and neck supple. No rigidity. No muscular tenderness.     Right lower leg: No  edema.     Left lower leg: No edema.     Comments: No kyphosis     Lymphadenopathy:     Cervical: No cervical adenopathy.  Skin:    General: Skin is warm and dry.     Coloration: Skin is not pale.     Findings: No erythema or rash.     Comments: Solar lentigines diffusely   Neurological:     Mental Status: She is alert. Mental status is at baseline.     Cranial Nerves: No cranial nerve deficit.     Motor: No abnormal muscle tone.     Coordination: Coordination normal.     Gait: Gait normal.     Deep Tendon Reflexes: Reflexes are normal and symmetric. Reflexes  normal.  Psychiatric:        Mood and Affect: Mood normal.        Cognition and Memory: Cognition and memory normal.           Assessment & Plan:   Problem List Items Addressed This Visit       Cardiovascular and Mediastinum   Essential hypertension    bp in fair control at this time  BP Readings from Last 1 Encounters:  04/06/22 122/78  No changes needed Most recent labs reviewed  Disc lifstyle change with low sodium diet and exercise  Plan to continue hctz 25 mg daily with klor con       Relevant Medications   rosuvastatin (CRESTOR) 5 MG tablet   hydrochlorothiazide (HYDRODIURIL) 25 MG tablet     Musculoskeletal and Integument   Osteopenia    Fairly stable on dexa 09/2021 No fractures (one fall out of bed) in general good balance  Taking D3 Good exercise  Will re check dexa  2y Disc fall prev        Other   Hyperlipidemia    Disc goals for lipids and reasons to control them Rev last labs with pt Rev low sat fat diet in detail  HDL down and trig are up  Goal LDL under 100  Rev her ASCVD risk score  Disc tx options/ choose low dose crestor 5 mg daily  Disc poss side eff/handout given  Lab planned for 4-6 weeks       Relevant Medications   rosuvastatin (CRESTOR) 5 MG tablet   hydrochlorothiazide (HYDRODIURIL) 25 MG tablet   Hypokalemia    With hctz   Taking 10 meq K daily  Level of 4-stable       Routine general medical examination at a health care facility - Primary    Here for health maintenance exam and to review chronic medical problems    Mammogram was yesterday-pending report  Declines std testing/no gyn c/o  Colonoscopy 12/2021 - no recall due to age dexa utd- no fractures and good exercise  Enc to continue vit D3 2000 iu daily

## 2022-04-06 NOTE — Assessment & Plan Note (Signed)
Disc goals for lipids and reasons to control them Rev last labs with pt Rev low sat fat diet in detail  HDL down and trig are up  Goal LDL under 100  Rev her ASCVD risk score  Disc tx options/ choose low dose crestor 5 mg daily  Disc poss side eff/handout given  Lab planned for 4-6 weeks

## 2022-04-06 NOTE — Assessment & Plan Note (Signed)
Possible bursitis or SI pain  Recommend ice and voltaren gel  F/u if no imp

## 2022-04-06 NOTE — Patient Instructions (Addendum)
For cholesterol Avoid red meat/ fried foods/ egg yolks/ fatty breakfast meats/ butter, cheese and high fat dairy/ and shellfish   Take crestor 5 mg daily in evening  If you develop side effects - muscle pain let us know  Schedule fasting labs in 4-6 weeks    Take care of yourself  Keep exercising   Try voltaren gel on the hip /sore spot  Try to put a pillow between your knees if you sleep on your side   If no improvement call for an appt with sports medicine - Dr Lorelei Pont

## 2022-04-06 NOTE — Assessment & Plan Note (Signed)
bp in fair control at this time  BP Readings from Last 1 Encounters:  04/06/22 122/78   No changes needed Most recent labs reviewed  Disc lifstyle change with low sodium diet and exercise  Plan to continue hctz 25 mg daily with klor con

## 2022-04-06 NOTE — Assessment & Plan Note (Signed)
Here for health maintenance exam and to review chronic medical problems    Mammogram was yesterday-pending report  Declines std testing/no gyn c/o  Colonoscopy 12/2021 - no recall due to age dexa utd- no fractures and good exercise  Enc to continue vit D3 2000 iu daily

## 2022-04-07 ENCOUNTER — Ambulatory Visit (INDEPENDENT_AMBULATORY_CARE_PROVIDER_SITE_OTHER): Payer: Medicare PPO | Admitting: *Deleted

## 2022-04-07 DIAGNOSIS — Z0000 Encounter for general adult medical examination without abnormal findings: Secondary | ICD-10-CM

## 2022-04-07 NOTE — Patient Instructions (Signed)
Melanie Marshall , Thank you for taking time to come for your Medicare Wellness Visit. I appreciate your ongoing commitment to your health goals. Please review the following plan we discussed and let me know if I can assist you in the future.   Screening recommendations/referrals: Colonoscopy: up to date Mammogram: up to date Bone Density: up to date Recommended yearly ophthalmology/optometry visit for glaucoma screening and checkup Recommended yearly dental visit for hygiene and checkup  Vaccinations: Influenza vaccine: up to date Pneumococcal vaccine: up to date Tdap vaccine: up to date Shingles vaccine: up to date    Advanced directives: on file     Preventive Care 76 Years and Older, Female Preventive care refers to lifestyle choices and visits with your health care provider that can promote health and wellness. What does preventive care include? A yearly physical exam. This is also called an annual well check. Dental exams once or twice a year. Routine eye exams. Ask your health care provider how often you should have your eyes checked. Personal lifestyle choices, including: Daily care of your teeth and gums. Regular physical activity. Eating a healthy diet. Avoiding tobacco and drug use. Limiting alcohol use. Practicing safe sex. Taking low-dose aspirin every day. Taking vitamin and mineral supplements as recommended by your health care provider. What happens during an annual well check? The services and screenings done by your health care provider during your annual well check will depend on your age, overall health, lifestyle risk factors, and family history of disease. Counseling  Your health care provider may ask you questions about your: Alcohol use. Tobacco use. Drug use. Emotional well-being. Home and relationship well-being. Sexual activity. Eating habits. History of falls. Memory and ability to understand (cognition). Work and work Statistician. Reproductive  health. Screening  You may have the following tests or measurements: Height, weight, and BMI. Blood pressure. Lipid and cholesterol levels. These may be checked every 5 years, or more frequently if you are over 58 years old. Skin check. Lung cancer screening. You may have this screening every year starting at age 60 if you have a 30-pack-year history of smoking and currently smoke or have quit within the past 15 years. Fecal occult blood test (FOBT) of the stool. You may have this test every year starting at age 21. Flexible sigmoidoscopy or colonoscopy. You may have a sigmoidoscopy every 5 years or a colonoscopy every 10 years starting at age 70. Hepatitis C blood test. Hepatitis B blood test. Sexually transmitted disease (STD) testing. Diabetes screening. This is done by checking your blood sugar (glucose) after you have not eaten for a while (fasting). You may have this done every 1-3 years. Bone density scan. This is done to screen for osteoporosis. You may have this done starting at age 57. Mammogram. This may be done every 1-2 years. Talk to your health care provider about how often you should have regular mammograms. Talk with your health care provider about your test results, treatment options, and if necessary, the need for more tests. Vaccines  Your health care provider may recommend certain vaccines, such as: Influenza vaccine. This is recommended every year. Tetanus, diphtheria, and acellular pertussis (Tdap, Td) vaccine. You may need a Td booster every 10 years. Zoster vaccine. You may need this after age 75. Pneumococcal 13-valent conjugate (PCV13) vaccine. One dose is recommended after age 79. Pneumococcal polysaccharide (PPSV23) vaccine. One dose is recommended after age 67. Talk to your health care provider about which screenings and vaccines you need and  how often you need them. This information is not intended to replace advice given to you by your health care provider.  Make sure you discuss any questions you have with your health care provider. Document Released: 08/28/2015 Document Revised: 04/20/2016 Document Reviewed: 06/02/2015 Elsevier Interactive Patient Education  2017 [place] Prevention in the Home Falls can cause injuries. They can happen to people of all ages. There are many things you can do to make your home safe and to help prevent falls. What can I do on the outside of my home? Regularly fix the edges of walkways and driveways and fix any cracks. Remove anything that might make you trip as you walk through a door, such as a raised step or threshold. Trim any bushes or trees on the path to your home. Use bright outdoor lighting. Clear any walking paths of anything that might make someone trip, such as rocks or tools. Regularly check to see if handrails are loose or broken. Make sure that both sides of any steps have handrails. Any raised decks and porches should have guardrails on the edges. Have any leaves, snow, or ice cleared regularly. Use sand or salt on walking paths during winter. Clean up any spills in your garage right away. This includes oil or grease spills. What can I do in the bathroom? Use night lights. Install grab bars by the toilet and in the tub and shower. Do not use towel bars as grab bars. Use non-skid mats or decals in the tub or shower. If you need to sit down in the shower, use a plastic, non-slip stool. Keep the floor dry. Clean up any water that spills on the floor as soon as it happens. Remove soap buildup in the tub or shower regularly. Attach bath mats securely with double-sided non-slip rug tape. Do not have throw rugs and other things on the floor that can make you trip. What can I do in the bedroom? Use night lights. Make sure that you have a light by your bed that is easy to reach. Do not use any sheets or blankets that are too big for your bed. They should not hang down onto the floor. Have a  firm chair that has side arms. You can use this for support while you get dressed. Do not have throw rugs and other things on the floor that can make you trip. What can I do in the kitchen? Clean up any spills right away. Avoid walking on wet floors. Keep items that you use a lot in easy-to-reach places. If you need to reach something above you, use a strong step stool that has a grab bar. Keep electrical cords out of the way. Do not use floor polish or wax that makes floors slippery. If you must use wax, use non-skid floor wax. Do not have throw rugs and other things on the floor that can make you trip. What can I do with my stairs? Do not leave any items on the stairs. Make sure that there are handrails on both sides of the stairs and use them. Fix handrails that are broken or loose. Make sure that handrails are as long as the stairways. Check any carpeting to make sure that it is firmly attached to the stairs. Fix any carpet that is loose or worn. Avoid having throw rugs at the top or bottom of the stairs. If you do have throw rugs, attach them to the floor with carpet tape. Make sure that you have  a light switch at the top of the stairs and the bottom of the stairs. If you do not have them, ask someone to add them for you. What else can I do to help prevent falls? Wear shoes that: Do not have high heels. Have rubber bottoms. Are comfortable and fit you well. Are closed at the toe. Do not wear sandals. If you use a stepladder: Make sure that it is fully opened. Do not climb a closed stepladder. Make sure that both sides of the stepladder are locked into place. Ask someone to hold it for you, if possible. Clearly mark and make sure that you can see: Any grab bars or handrails. First and last steps. Where the edge of each step is. Use tools that help you move around (mobility aids) if they are needed. These include: Canes. Walkers. Scooters. Crutches. Turn on the lights when you  go into a dark area. Replace any light bulbs as soon as they burn out. Set up your furniture so you have a clear path. Avoid moving your furniture around. If any of your floors are uneven, fix them. If there are any pets around you, be aware of where they are. Review your medicines with your doctor. Some medicines can make you feel dizzy. This can increase your chance of falling. Ask your doctor what other things that you can do to help prevent falls. This information is not intended to replace advice given to you by your health care provider. Make sure you discuss any questions you have with your health care provider. Document Released: 05/28/2009 Document Revised: 01/07/2016 Document Reviewed: 09/05/2014 Elsevier Interactive Patient Education  2017 [employer].

## 2022-04-07 NOTE — Progress Notes (Signed)
Subjective:   Melanie Marshall is a 76 y.o. female who presents for Medicare Annual (Subsequent) preventive examination.  I connected with  Melanie Marshall on 04/07/22 by a telephone enabled telemedicine application and verified that I am speaking with the correct person using two identifiers.   I discussed the limitations of evaluation and management by telemedicine. The patient expressed understanding and agreed to proceed.  Patient location: home  Provider location: Tele-Health-home    Review of Systems     Cardiac Risk Factors include: advanced age (>34mn, >>23women);hypertension;family history of premature cardiovascular disease     Objective:    Today's Vitals   There is no height or weight on file to calculate BMI.     04/07/2022   11:01 AM 04/01/2020    9:06 AM 03/26/2018    8:19 AM 03/10/2016    8:47 AM  Advanced Directives  Does Patient Have a Medical Advance Directive? Yes Yes Yes Yes  Type of [place] Living will;Healthcare Power of Attorney  Does patient want to make changes to medical advance directive?    No - Patient declined  Copy of [place] Chart? Yes - validated most recent copy scanned in chart (See row information) No - copy requested Yes Yes    Current Medications (verified) Outpatient Encounter Medications as of 04/07/2022  Medication Sig   Cholecalciferol (VITAMIN D3) 2000 units TABS Take 1 tablet by mouth daily.   hydrochlorothiazide (HYDRODIURIL) 25 MG tablet Take 1 tablet (25 mg total) by mouth daily.   ibuprofen (ADVIL,MOTRIN) 200 MG tablet Take 200 mg by mouth every 6 (six) hours as needed.   Multiple Vitamin (MULTIVITAMIN) capsule Take 1 capsule by mouth daily.   Multiple Vitamins-Minerals (PRESERVISION AREDS PO) Take 1 tablet by mouth 2 (two) times daily.   potassium chloride (KLOR-CON) 10 MEQ tablet Take  1 tablet (10 mEq total) by mouth daily.   rosuvastatin (CRESTOR) 5 MG tablet Take 1 tablet (5 mg total) by mouth daily.   No facility-administered encounter medications on file as of 04/07/2022.    Allergies (verified) Patient has no known allergies.   History: Past Medical History:  Diagnosis Date   Allergy    allergic rhinitis   Arthritis    Heart murmur    not treated   History of blood transfusion 08/15/1968   after childbirth   Hyperlipidemia    Hypertension    Osteopenia    Past Surgical History:  Procedure Laterality Date   COLONOSCOPY     TUBAL LIGATION  08/15/1977   Family History  Problem Relation Age of Onset   Diabetes Mother    Heart disease Mother        MI   Hypertension Father        ??HTN   Hyperlipidemia Father    Pancreatic cancer Father    Hypertension Brother    Cancer Brother        pancreastic (suspect)    Breast cancer Maternal Grandmother    Cancer Paternal Grandmother        breast CA   Colon cancer Neg Hx    Stomach cancer Neg Hx    Colon polyps Neg Hx    Esophageal cancer Neg Hx    Rectal cancer Neg Hx    Social History   Socioeconomic History   Marital status: Widowed    Spouse name: Not on  file   Number of children: Not on file   Years of education: Not on file   Highest education level: Not on file  Occupational History   Not on file  Tobacco Use   Smoking status: Former    Types: Cigarettes    Quit date: 08/16/1967    Years since quitting: 49.6   Smokeless tobacco: Never  Vaping Use   Vaping Use: Never used  Substance and Sexual Activity   Alcohol use: Yes    Alcohol/week: 1.0 standard drink of alcohol    Types: 1 Glasses of wine per week    Comment: occ   Drug use: No   Sexual activity: Never  Other Topics Concern   Not on file  Social History Narrative   Not on file   Social Determinants of Health   Financial Resource Strain: Low Risk  (04/07/2022)   Overall Financial Resource Strain (CARDIA)     Difficulty of Paying Living Expenses: Not hard at all  Food Insecurity: No Food Insecurity (04/07/2022)   Hunger Vital Sign    Worried About Running Out of Food in the Last Year: Never true    Ran Out of Food in the Last Year: Never true  Transportation Needs: No Transportation Needs (04/07/2022)   PRAPARE - Hydrologist (Medical): No    Lack of Transportation (Non-Medical): No  Physical Activity: Insufficiently Active (04/07/2022)   Exercise Vital Sign    Days of Exercise per Week: 2 days    Minutes of Exercise per Session: 20 min  Stress: No Stress Concern Present (04/07/2022)   [place]    Feeling of Stress : Not at all  Social Connections: [place] (04/07/2022)   Social Connection and Isolation Panel [NHANES]    Frequency of Communication with Friends and Family: More than three times a week    Frequency of Social Gatherings with Friends and Family: Twice a week    Attends Religious Services: More than 4 times per year    Active Member of [employer] or Organizations: Yes    Attends Music therapist: More than 4 times per year    Marital Status: Married    Tobacco Counseling Counseling given: Not Answered   Clinical Intake:  Pre-visit preparation completed: Yes  Pain : No/denies pain     Nutritional Risks: None Diabetes: No  How often do you need to have someone help you when you read instructions, pamphlets, or other written materials from your doctor or pharmacy?: 1 - Never  Diabetic?  no  Interpreter Needed?: No  Information entered by :: Leroy Kennedy LPN   Activities of Daily Living    04/07/2022   11:05 AM  In your present state of health, do you have any difficulty performing the following activities:  Hearing? 0  Vision? 0  Difficulty concentrating or making decisions? 0  Walking or climbing stairs? 0  Dressing or bathing? 0  Doing errands,  shopping? 0  Preparing Food and eating ? N  Using the Toilet? N  In the past six months, have you accidently leaked urine? N  Do you have problems with loss of bowel control? N  Managing your Medications? N  Managing your Finances? N  Housekeeping or managing your Housekeeping? N    Patient Care Team: Tower, Wynelle Fanny, MD as PCP - General Conley Simmonds, DDS as Referring Physician (Dentistry) Monna Fam, MD as Consulting Physician (Ophthalmology)  Indicate any recent Medical Services you may have received from other than [facility] providers in the past year (date may be approximate).     Assessment:   This is a routine wellness examination for Melanie Marshall.  Hearing/Vision screen Hearing Screening - Comments:: No trouble hearing Vision Screening - Comments:: Up to date Herbert Deaner  Dietary issues and exercise activities discussed: Current Exercise Habits: Structured exercise class, Type of exercise: strength training/weights, Time (Minutes): 40, Frequency (Times/Week): 2, Weekly Exercise (Minutes/Week): 80, Intensity: Mild   Goals Addressed             This Visit's Progress    Weight (lb) < 200 lb (90.7 kg)         Depression Screen    04/07/2022   11:07 AM 04/06/2022    8:53 AM 04/05/2021    8:22 AM 04/01/2020    9:08 AM 04/01/2019   10:50 AM 03/26/2018    8:18 AM 03/14/2017   11:19 AM  PHQ 2/9 Scores  PHQ - 2 Score 0 0 0 0 0 0 0  PHQ- 9 Score    0  0 0    Fall Risk    04/07/2022   11:02 AM 04/06/2022    8:53 AM 04/05/2021    8:22 AM 04/01/2020    9:07 AM 04/01/2019   11:38 AM  Fall Risk   Falls in the past year? '1 1 1 '$ 0 0  Number falls in past yr: 0 0  0 0  Injury with Fall? 0 0  0 0  Risk for fall due to :    Medication side effect   Follow up Falls evaluation completed;Education provided;Falls prevention discussed Falls evaluation completed Falls evaluation completed Falls evaluation completed;Falls prevention discussed     FALL RISK PREVENTION PERTAINING TO THE  HOME:  Any stairs in or around the home? Yes  If so, are there any without handrails? No  Home free of loose throw rugs in walkways, pet beds, electrical cords, etc? Yes  Adequate lighting in your home to reduce risk of falls? Yes   ASSISTIVE DEVICES UTILIZED TO PREVENT FALLS:  Life alert? No  Use of a cane, walker or w/c? No  Grab bars in the bathroom? Yes  Shower chair or bench in shower? Yes  Elevated toilet seat or a handicapped toilet? Yes   TIMED UP AND GO:  Was the test performed? No .    Cognitive Function:    04/01/2020    9:09 AM 03/26/2018    8:19 AM 03/10/2016    8:47 AM  MMSE - Mini Mental State Exam  Orientation to time '5 5 5  '$ Orientation to Place '5 5 5  '$ Registration '3 3 3  '$ Attention/ Calculation 5 0 0  Recall '3 3 3  '$ Language- name 2 objects  0 0  Language- repeat '1 1 1  '$ Language- follow 3 step command  3 3  Language- read & follow direction  0 0  Write a sentence  0 0  Copy design  0 0  Total score  20 20        04/07/2022   11:03 AM  6CIT Screen  What Year? 0 points  What month? 0 points  What time? 0 points  Count back from 20 0 points  Months in reverse 0 points  Repeat phrase 0 points  Total Score 0 points    Immunizations Immunization History  Administered Date(s) Administered   Fluad Quad(high Dose 65+) 04/25/2019   Hepatitis  A 06/24/2008   Influenza Split 06/10/2011   Influenza Whole 07/23/2010   Influenza, High Dose Seasonal PF 05/08/2015, 05/16/2016, 05/11/2020, 06/10/2021   Influenza,inj,Quad PF,6+ Mos 04/18/2018   Influenza-Unspecified 05/15/2013, 05/16/2014, 05/08/2017   PFIZER Comirnaty(Gray Top)Covid-19 Tri-Sucrose Vaccine 12/14/2020   PFIZER(Purple Top)SARS-COV-2 Vaccination 10/18/2019, 11/08/2019, 05/11/2020   Pfizer Covid-19 Vaccine Bivalent Booster 70yr & up 06/10/2021   Pneumococcal Conjugate-13 12/06/2013   Pneumococcal Polysaccharide-23 09/23/2011   Td 05/18/2007   Tdap 10/31/2017   Zoster Recombinat (Shingrix)  11/09/2020, 03/17/2021   Zoster, Live 06/29/2012    TDAP status: Up to date  Flu Vaccine status: Up to date  Pneumococcal vaccine status: Up to date  Covid-19 vaccine status: Completed vaccines  Qualifies for Shingles Vaccine? No   Zostavax completed Yes   Shingrix Completed?: Yes  Screening Tests Health Maintenance  Topic Date Due   COVID-19 Vaccine (6 - Pfizer series) 10/11/2021   INFLUENZA VACCINE  03/15/2022   Hepatitis C Screening  09/17/2023 (Originally 07/26/1964)   MAMMOGRAM  04/06/2023   TETANUS/TDAP  11/01/2027   COLONOSCOPY (Pts 45-421yrInsurance coverage will need to be confirmed)  12/18/2031   Pneumonia Vaccine 6583Years old  Completed   DEXA SCAN  Completed   Zoster Vaccines- Shingrix  Completed   HPV VACCINES  Aged Out    Health Maintenance  Health Maintenance Due  Topic Date Due   COVID-19 Vaccine (6 - Pfizer series) 10/11/2021   INFLUENZA VACCINE  03/15/2022    Colorectal cancer screening: Type of screening: Colonoscopy. Completed 2023. Repeat every 0 years  Mammogram status: Completed  . Repeat every year  Bone Density status: Completed 2023. Results reflect: Bone density results: OSTEOPENIA. Repeat every 2 years.  Lung Cancer Screening: (Low Dose CT Chest recommended if Age 76-80ears, 30 pack-year currently smoking OR have quit w/in 15years.) does not qualify.   Lung Cancer Screening Referral:   Additional Screening:  Hepatitis C Screening:  qualify;   Vision Screening: Recommended annual ophthalmology exams for early detection of glaucoma and other disorders of the eye. Is the patient up to date with their annual eye exam?  Yes  Who is the provider or what is the name of the office in which the patient attends annual eye exams? HeHerbert Deanerf pt is not established with a provider, would they like to be referred to a provider to establish care? No .   Dental Screening: Recommended annual dental exams for proper oral hygiene  Community  Resource Referral / Chronic Care Management: CRR required this visit?  No   CCM required this visit?  No      Plan:     I have personally reviewed and noted the following in the patient's chart:   Medical and social history Use of alcohol, tobacco or illicit drugs  Current medications and supplements including opioid prescriptions. Patient is not currently taking opioid prescriptions. Functional ability and status Nutritional status Physical activity Advanced directives List of other physicians Hospitalizations, surgeries, and ER visits in previous 12 months Vitals Screenings to include cognitive, depression, and falls Referrals and appointments  In addition, I have reviewed and discussed with patient certain preventive protocols, quality metrics, and best practice recommendations. A written personalized care plan for preventive services as well as general preventive health recommendations were provided to patient.     JuLeroy KennedyLPN   [id number] Nurse Notes:

## 2022-05-01 ENCOUNTER — Telehealth: Payer: Self-pay | Admitting: Family Medicine

## 2022-05-01 DIAGNOSIS — E7800 Pure hypercholesterolemia, unspecified: Secondary | ICD-10-CM

## 2022-05-01 NOTE — Telephone Encounter (Signed)
-----   Message from Ellamae Sia sent at 04/26/2022  3:15 PM EDT ----- Regarding: Lab orders for Wednesday, 9.20.23 Lab orders, thanks

## 2022-05-04 ENCOUNTER — Other Ambulatory Visit (INDEPENDENT_AMBULATORY_CARE_PROVIDER_SITE_OTHER): Payer: Medicare PPO

## 2022-05-04 DIAGNOSIS — E7800 Pure hypercholesterolemia, unspecified: Secondary | ICD-10-CM | POA: Diagnosis not present

## 2022-05-04 LAB — LIPID PANEL
Cholesterol: 138 mg/dL (ref 0–200)
HDL: 52.2 mg/dL (ref >39.00)
LDL Cholesterol: 66 mg/dL (ref 0–99)
NonHDL: 85.34
Total CHOL/HDL Ratio: 3
Triglycerides: 97 mg/dL (ref 0.0–149.0)
VLDL: 19.4 mg/dL (ref 0.0–40.0)

## 2022-05-04 LAB — ALT: ALT: 20 U/L (ref 0–35)

## 2022-05-04 LAB — AST: AST: 19 U/L (ref 0–37)

## 2022-06-01 ENCOUNTER — Encounter: Payer: Self-pay | Admitting: Family

## 2022-06-01 ENCOUNTER — Ambulatory Visit: Payer: Medicare PPO | Admitting: Family

## 2022-06-01 VITALS — BP 122/72 | HR 70 | Temp 98.6°F | Resp 16 | Ht 59.0 in | Wt 159.5 lb

## 2022-06-01 DIAGNOSIS — R300 Dysuria: Secondary | ICD-10-CM

## 2022-06-01 DIAGNOSIS — N3000 Acute cystitis without hematuria: Secondary | ICD-10-CM | POA: Diagnosis not present

## 2022-06-01 LAB — POC URINALSYSI DIPSTICK (AUTOMATED)
Bilirubin, UA: NEGATIVE
Glucose, UA: NEGATIVE
Ketones, UA: NEGATIVE
Nitrite, UA: NEGATIVE
Protein, UA: NEGATIVE
Spec Grav, UA: 1.01 (ref 1.010–1.025)
Urobilinogen, UA: 0.2 E.U./dL
pH, UA: 6 (ref 5.0–8.0)

## 2022-06-01 MED ORDER — CIPROFLOXACIN HCL 500 MG PO TABS: 500.0000 mg | ORAL_TABLET | Freq: Two times a day (BID) | ORAL | 0 refills | Status: AC

## 2022-06-01 NOTE — Progress Notes (Signed)
Established Patient Office Visit  Subjective:  Patient ID: Melanie Marshall, female    DOB: 11-Apr-1946  Age: 76 y.o. MRN: [id number]  CC:  Chief Complaint  Patient presents with   Urinary Tract Infection    Started yesterday   Urinary Frequency    HPI Melanie Marshall is here today with concerns.   Yesterday started with symptoms suggestive of UTI. She started with urinary frequency and also dysuria. Took some uricalm which helped with frequency and urgency. Her urine is currently bright orange. Chills yesterday, no fever. No back pain.   Some mild suprapubic discomfort, better today.   Past Medical History:  Diagnosis Date   Allergy    allergic rhinitis   Arthritis    Heart murmur    not treated   History of blood transfusion 08/15/1968   after childbirth   Hyperlipidemia    Hypertension    Osteopenia     Past Surgical History:  Procedure Laterality Date   COLONOSCOPY     TUBAL LIGATION  08/15/1977    Family History  Problem Relation Age of Onset   Diabetes Mother    Heart disease Mother        MI   Hypertension Father        ??HTN   Hyperlipidemia Father    Pancreatic cancer Father    Hypertension Brother    Cancer Brother        pancreastic (suspect)    Breast cancer Maternal Grandmother    Cancer Paternal Grandmother        breast CA   Colon cancer Neg Hx    Stomach cancer Neg Hx    Colon polyps Neg Hx    Esophageal cancer Neg Hx    Rectal cancer Neg Hx     Social History   Socioeconomic History   Marital status: Widowed    Spouse name: Not on file   Number of children: Not on file   Years of education: Not on file   Highest education level: Not on file  Occupational History   Not on file  Tobacco Use   Smoking status: Former    Types: Cigarettes    Quit date: 08/16/1967    Years since quitting: 54.8   Smokeless tobacco: Never  Vaping Use   Vaping Use: Never used  Substance and Sexual Activity   Alcohol use: Yes    Alcohol/week: 1.0  standard drink of alcohol    Types: 1 Glasses of wine per week    Comment: occ   Drug use: No   Sexual activity: Never  Other Topics Concern   Not on file  Social History Narrative   Not on file   Social Determinants of Health   Financial Resource Strain: Low Risk  (04/07/2022)   Overall Financial Resource Strain (CARDIA)    Difficulty of Paying Living Expenses: Not hard at all  Food Insecurity: No Food Insecurity (04/07/2022)   Hunger Vital Sign    Worried About Running Out of Food in the Last Year: Never true    Ran Out of Food in the Last Year: Never true  Transportation Needs: No Transportation Needs (04/07/2022)   PRAPARE - Hydrologist (Medical): No    Lack of Transportation (Non-Medical): No  Physical Activity: Insufficiently Active (04/07/2022)   Exercise Vital Sign    Days of Exercise per Week: 2 days    Minutes of Exercise per Session: 20 min  Stress: No  Stress Concern Present (04/07/2022)   [place]    Feeling of Stress : Not at all  Social Connections: [place] (04/07/2022)   Social Connection and Isolation Panel [NHANES]    Frequency of Communication with Friends and Family: More than three times a week    Frequency of Social Gatherings with Friends and Family: Twice a week    Attends Religious Services: More than 4 times per year    Active Member of [employer] or Organizations: Yes    Attends Music therapist: More than 4 times per year    Marital Status: Married  Human resources officer Violence: Not At Risk (04/07/2022)   Humiliation, Afraid, Rape, and Kick questionnaire    Fear of Current or Ex-Partner: No    Emotionally Abused: No    Physically Abused: No    Sexually Abused: No    Outpatient Medications Prior to Visit  Medication Sig Dispense Refill   Cholecalciferol (VITAMIN D3) 2000 units TABS Take 1 tablet by mouth daily.     hydrochlorothiazide  (HYDRODIURIL) 25 MG tablet Take 1 tablet (25 mg total) by mouth daily. 90 tablet 3   ibuprofen (ADVIL,MOTRIN) 200 MG tablet Take 200 mg by mouth every 6 (six) hours as needed.     Multiple Vitamin (MULTIVITAMIN) capsule Take 1 capsule by mouth daily.     Multiple Vitamins-Minerals (PRESERVISION AREDS PO) Take 1 tablet by mouth 2 (two) times daily.     potassium chloride (KLOR-CON) 10 MEQ tablet Take 1 tablet (10 mEq total) by mouth daily. 90 tablet 3   rosuvastatin (CRESTOR) 5 MG tablet Take 1 tablet (5 mg total) by mouth daily. 90 tablet 3   No facility-administered medications prior to visit.    No Known Allergies      Objective:    Physical Exam Constitutional:      General: She is not in acute distress.    Appearance: Normal appearance. She is well-developed and well-groomed. She is not ill-appearing, toxic-appearing or diaphoretic.  HENT:     Mouth/Throat:     Pharynx: No pharyngeal swelling.     Tonsils: No tonsillar exudate.  Neck:     Thyroid: No thyroid mass.  Pulmonary:     Effort: Pulmonary effort is normal.  Abdominal:     Tenderness: There is no abdominal tenderness. There is no right CVA tenderness or left CVA tenderness.  Musculoskeletal:     Lumbar back: Normal. No tenderness.     Comments: No flank pain no CVA tenderness  Lymphadenopathy:     Cervical:     Right cervical: No superficial cervical adenopathy.    Left cervical: No superficial cervical adenopathy.  Neurological:     General: No focal deficit present.     Mental Status: She is alert and oriented to person, place, and time. Mental status is at baseline.  Psychiatric:        Mood and Affect: Mood normal.        Behavior: Behavior normal.        Thought Content: Thought content normal.        Judgment: Judgment normal.     BP 122/72   Pulse 70   Temp 98.6 F (37 C)   Resp 16   Ht '4\' 11"'$  (1.499 m)   Wt 159 lb 8 oz (72.3 kg)   SpO2 99%   BMI 32.22 kg/m  Wt Readings from Last 3  Encounters:  06/01/22 159 lb  8 oz (72.3 kg)  04/06/22 158 lb (71.7 kg)  12/17/21 150 lb (68 kg)     There are no preventive care reminders to display for this patient.  There are no preventive care reminders to display for this patient.  Lab Results  Component Value Date   TSH 1.82 03/30/2022   Lab Results  Component Value Date   WBC 5.2 03/30/2022   HGB 12.4 03/30/2022   HCT 37.0 03/30/2022   MCV 90.6 03/30/2022   PLT 316.0 03/30/2022   Lab Results  Component Value Date   NA 136 03/30/2022   K 4.0 03/30/2022   CO2 28 03/30/2022   GLUCOSE 92 03/30/2022   BUN 20 03/30/2022   CREATININE 1.05 03/30/2022   BILITOT 0.6 03/30/2022   ALKPHOS 47 03/30/2022   AST 19 05/04/2022   ALT 20 05/04/2022   PROT 7.0 03/30/2022   ALBUMIN 4.4 03/30/2022   CALCIUM 9.2 03/30/2022   GFR 51.91 (L) 03/30/2022   No results found for: "HGBA1C"    Assessment & Plan:   Problem List Items Addressed This Visit       Genitourinary   Acute cystitis without hematuria     poct urine dip in office Urine culture ordered pending results antbx sent to pharmacy, pt to take as directed. Encouraged increased water intake throughout the day. Choosing to treat due to being symptomatic. If no improvement in the next 2 days pt advised to let me know.       Relevant Medications   ciprofloxacin (CIPRO) 500 MG tablet   Other Visit Diagnoses     Dysuria    -  Primary   Relevant Orders   Urine Culture   POCT Urinalysis Dipstick (Automated) (Completed)       Meds ordered this encounter  Medications   ciprofloxacin (CIPRO) 500 MG tablet    Sig: Take 1 tablet (500 mg total) by mouth 2 (two) times daily for 3 days.    Dispense:  6 tablet    Refill:  0    Order Specific Question:   Supervising Provider    Answer:   Diona Browner, AMY E [[id number]]    Follow-up: Return for f/u with primary care provider if no improvement.    Eugenia Pancoast, FNP

## 2022-06-01 NOTE — Patient Instructions (Signed)
It was a pleasure seeing you today.   You were found to have a urinary tract infection, you have been prescribed an antibiotic to your preferred pharmacy. Please start antibiotic today as directed.   We are sending your urine for a culture to make sure you do not have a resistant bacteria. We will call you if we need to change your medications.   Please make sure you are drinking plenty of fluids over the next few days.  If your symptoms do not improve over the next 5-7 days, or if they worsen, please let us know. Please also let us know if you have worsening back pain, fevers, chills, or body aches.   Regards,   Ravinder Hofland  

## 2022-06-01 NOTE — Assessment & Plan Note (Signed)
poct urine dip in office Urine culture ordered pending results antbx sent to pharmacy, pt to take as directed. Encouraged increased water intake throughout the day. Choosing to treat due to being symptomatic. If no improvement in the next 2 days pt advised to let me know.  

## 2022-06-04 LAB — URINE CULTURE
MICRO NUMBER:: 14067929
SPECIMEN QUALITY:: ADEQUATE

## 2022-12-05 DIAGNOSIS — H25013 Cortical age-related cataract, bilateral: Secondary | ICD-10-CM | POA: Diagnosis not present

## 2022-12-05 DIAGNOSIS — H353132 Nonexudative age-related macular degeneration, bilateral, intermediate dry stage: Secondary | ICD-10-CM | POA: Diagnosis not present

## 2022-12-05 DIAGNOSIS — H35373 Puckering of macula, bilateral: Secondary | ICD-10-CM | POA: Diagnosis not present

## 2022-12-05 DIAGNOSIS — H35033 Hypertensive retinopathy, bilateral: Secondary | ICD-10-CM | POA: Diagnosis not present

## 2022-12-05 DIAGNOSIS — H2513 Age-related nuclear cataract, bilateral: Secondary | ICD-10-CM | POA: Diagnosis not present

## 2023-03-13 ENCOUNTER — Other Ambulatory Visit: Payer: Self-pay | Admitting: Family Medicine

## 2023-03-13 DIAGNOSIS — Z1231 Encounter for screening mammogram for malignant neoplasm of breast: Secondary | ICD-10-CM

## 2023-03-15 ENCOUNTER — Encounter (INDEPENDENT_AMBULATORY_CARE_PROVIDER_SITE_OTHER): Payer: Self-pay

## 2023-03-30 ENCOUNTER — Other Ambulatory Visit: Payer: Self-pay | Admitting: Family Medicine

## 2023-03-30 ENCOUNTER — Ambulatory Visit (INDEPENDENT_AMBULATORY_CARE_PROVIDER_SITE_OTHER): Payer: Medicare PPO

## 2023-03-30 DIAGNOSIS — Z0000 Encounter for general adult medical examination without abnormal findings: Secondary | ICD-10-CM | POA: Diagnosis not present

## 2023-03-30 NOTE — Progress Notes (Signed)
Subjective:   Melanie Marshall is a 77 y.o. female who presents for Medicare Annual (Subsequent) preventive examination.  Visit Complete: Virtual  I connected with  [name] on 03/30/23 by a audio enabled telemedicine application and verified that I am speaking with the correct person using two identifiers.  Patient Location: Home  Provider Location: Office/Clinic  I discussed the limitations of evaluation and management by telemedicine. The patient expressed understanding and agreed to proceed.  Vital Signs: Unable to obtain new vitals due to this being a telehealth visit.  Review of Systems     Cardiac Risk Factors include: advanced age (>70men, >41 women);dyslipidemia;hypertension     Objective:    Today's Vitals   There is no height or weight on file to calculate BMI.     03/30/2023   12:31 PM 04/07/2022   11:01 AM 04/01/2020    9:06 AM 03/26/2018    8:19 AM 03/10/2016    8:47 AM  Advanced Directives  Does Patient Have a Medical Advance Directive? Yes Yes Yes Yes Yes  Type of Estate agent of [place];Living will Healthcare Power of [employer] of [place];Living will Healthcare Power of [place];Living will Living will;Healthcare Power of Attorney  Does patient want to make changes to medical advance directive?     No - Patient declined  Copy of Healthcare Power of Attorney in Chart? No - copy requested Yes - validated most recent copy scanned in chart (See row information) No - copy requested Yes Yes    Current Medications (verified) Outpatient Encounter Medications as of 03/30/2023  Medication Sig   Cholecalciferol (VITAMIN D3) 2000 units TABS Take 1 tablet by mouth daily.   hydrochlorothiazide (HYDRODIURIL) 25 MG tablet Take 1 tablet (25 mg total) by mouth daily.   ibuprofen (ADVIL,MOTRIN) 200 MG tablet Take 200 mg by mouth every 6 (six) hours as needed.   Multiple Vitamins-Minerals (PRESERVISION AREDS PO) Take 1 tablet by mouth  2 (two) times daily.   potassium chloride (KLOR-CON) 10 MEQ tablet Take 1 tablet (10 mEq total) by mouth daily.   rosuvastatin (CRESTOR) 5 MG tablet TAKE 1 TABLET (5 MG TOTAL) BY MOUTH DAILY.   Multiple Vitamin (MULTIVITAMIN) capsule Take 1 capsule by mouth daily. (Patient not taking: Reported on 03/30/2023)   No facility-administered encounter medications on file as of 03/30/2023.    Allergies (verified) Patient has no known allergies.   History: Past Medical History:  Diagnosis Date   Allergy    allergic rhinitis   Arthritis    Heart murmur    not treated   History of blood transfusion 08/15/1968   after childbirth   Hyperlipidemia    Hypertension    Osteopenia    Past Surgical History:  Procedure Laterality Date   COLONOSCOPY     TUBAL LIGATION  08/15/1977   Family History  Problem Relation Age of Onset   Diabetes Mother    Heart disease Mother        MI   Hypertension Father        ??HTN   Hyperlipidemia Father    Pancreatic cancer Father    Hypertension Brother    Cancer Brother        pancreastic (suspect)    Breast cancer Maternal Grandmother    Cancer Paternal Grandmother        breast CA   Colon cancer Neg Hx    Stomach cancer Neg Hx    Colon polyps Neg Hx  Esophageal cancer Neg Hx    Rectal cancer Neg Hx    Social History   Socioeconomic History   Marital status: Widowed    Spouse name: Not on file   Number of children: Not on file   Years of education: Not on file   Highest education level: Not on file  Occupational History   Not on file  Tobacco Use   Smoking status: Former    Current packs/day: 0.00    Types: Cigarettes    Quit date: 08/16/1967    Years since quitting: 55.6   Smokeless tobacco: Never  Vaping Use   Vaping status: Never Used  Substance and Sexual Activity   Alcohol use: Yes    Alcohol/week: 1.0 standard drink of alcohol    Types: 1 Glasses of wine per week    Comment: occ   Drug use: No   Sexual activity: Never   Other Topics Concern   Not on file  Social History Narrative   Not on file   Social Determinants of Health   Financial Resource Strain: Low Risk  (03/30/2023)   Overall Financial Resource Strain (CARDIA)    Difficulty of Paying Living Expenses: Not hard at all  Food Insecurity: No Food Insecurity (03/30/2023)   Hunger Vital Sign    Worried About Running Out of Food in the Last Year: Never true    Ran Out of Food in the Last Year: Never true  Transportation Needs: No Transportation Needs (03/30/2023)   PRAPARE - Administrator, Civil Service (Medical): No    Lack of Transportation (Non-Medical): No  Physical Activity: Inactive (03/30/2023)   Exercise Vital Sign    Days of Exercise per Week: 0 days    Minutes of Exercise per Session: 0 min  Stress: No Stress Concern Present (03/30/2023)   [employer] of Occupational Health - Occupational Stress Questionnaire    Feeling of Stress : Only a little  Social Connections: Moderately Integrated (03/30/2023)   Social Connection and Isolation Panel [NHANES]    Frequency of Communication with Friends and Family: More than three times a week    Frequency of Social Gatherings with Friends and Family: Once a week    Attends Religious Services: More than 4 times per year    Active Member of [employer] or Organizations: Yes    Attends Banker Meetings: More than 4 times per year    Marital Status: Widowed    Tobacco Counseling Counseling given: Not Answered   Clinical Intake:  Pre-visit preparation completed: Yes  Pain : No/denies pain     Nutritional Risks: None Diabetes: No  How often do you need to have someone help you when you read instructions, pamphlets, or other written materials from your doctor or pharmacy?: 1 - Never  Interpreter Needed?: No  Information entered by :: NAllen LPN   Activities of Daily Living    03/30/2023   12:27 PM 04/07/2022   11:05 AM  In your present state of health, do you  have any difficulty performing the following activities:  Hearing? 0 0  Vision? 0 0  Difficulty concentrating or making decisions? 0 0  Walking or climbing stairs? 0 0  Dressing or bathing? 0 0  Doing errands, shopping? 0 0  Preparing Food and eating ? N N  Using the Toilet? N N  In the past six months, have you accidently leaked urine? Y N  Comment with cough or sneeze   Do you have  problems with loss of bowel control? N N  Managing your Medications? N N  Managing your Finances? N N  Housekeeping or managing your Housekeeping? N N    Patient Care Team: Tower, Audrie Gallus, MD as PCP - General Ramiro Harvest, DDS as Referring Physician (Dentistry) Mateo Flow, MD as Consulting Physician (Ophthalmology)  Indicate any recent Medical Services you may have received from other than [facility] providers in the past year (date may be approximate).     Assessment:   This is a routine wellness examination for [place].  Hearing/Vision screen Hearing Screening - Comments:: Denies hearing issues Vision Screening - Comments:: Regular eye exams, Francisco Capuchin  Dietary issues and exercise activities discussed:     Goals Addressed             This Visit's Progress    Patient Stated       03/30/2023, wants to start exercising again       Depression Screen    03/30/2023   12:33 PM 04/07/2022   11:07 AM 04/06/2022    8:53 AM 04/05/2021    8:22 AM 04/01/2020    9:08 AM 04/01/2019   10:50 AM 03/26/2018    8:18 AM  PHQ 2/9 Scores  PHQ - 2 Score 0 0 0 0 0 0 0  PHQ- 9 Score 0    0  0    Fall Risk    03/30/2023   12:32 PM 04/07/2022   11:02 AM 04/06/2022    8:53 AM 04/05/2021    8:22 AM 04/01/2020    9:07 AM  Fall Risk   Falls in the past year? 0 1 1 1  0  Number falls in past yr: 0 0 0  0  Injury with Fall? 0 0 0  0  Risk for fall due to : Medication side effect    Medication side effect  Follow up Falls prevention discussed;Falls evaluation completed Falls evaluation completed;Education  provided;Falls prevention discussed Falls evaluation completed Falls evaluation completed Falls evaluation completed;Falls prevention discussed    MEDICARE RISK AT HOME:  Medicare Risk at Home - 03/30/23 1232     Any stairs in or around the home? No    If so, are there any without handrails? No    Home free of loose throw rugs in walkways, pet beds, electrical cords, etc? Yes    Adequate lighting in your home to reduce risk of falls? Yes    Life alert? No    Use of a cane, walker or w/c? No    Grab bars in the bathroom? Yes    Shower chair or bench in shower? Yes    Elevated toilet seat or a handicapped toilet? Yes             TIMED UP AND GO:  Was the test performed?  No    Cognitive Function:    04/01/2020    9:09 AM 03/26/2018    8:19 AM 03/10/2016    8:47 AM  MMSE - Mini Mental State Exam  Orientation to time 5 5 5   Orientation to Place 5 5 5   Registration 3 3 3   Attention/ Calculation 5 0 0  Recall 3 3 3   Language- name 2 objects  0 0  Language- repeat 1 1 1   Language- follow 3 step command  3 3  Language- read & follow direction  0 0  Write a sentence  0 0  Copy design  0 0  Total score  20  20        03/30/2023   12:33 PM 04/07/2022   11:03 AM  6CIT Screen  What Year? 0 points 0 points  What month? 0 points 0 points  What time? 0 points 0 points  Count back from 20 0 points 0 points  Months in reverse 0 points 0 points  Repeat phrase 0 points 0 points  Total Score 0 points 0 points    Immunizations Immunization History  Administered Date(s) Administered   Fluad Quad(high Dose 65+) 04/25/2019, 05/06/2022   Hepatitis A 06/24/2008   Influenza Split 06/10/2011   Influenza Whole 07/23/2010   Influenza, High Dose Seasonal PF 05/08/2015, 05/16/2016, 05/11/2020, 06/10/2021   Influenza,inj,Quad PF,6+ Mos 04/18/2018   Influenza-Unspecified 05/15/2013, 05/16/2014, 05/08/2017   PFIZER Comirnaty(Gray Top)Covid-19 Tri-Sucrose Vaccine 12/14/2020, 05/06/2022    PFIZER(Purple Top)SARS-COV-2 Vaccination 10/18/2019, 11/08/2019, 05/11/2020   Pfizer Covid-19 Vaccine Bivalent Booster 74yrs & up 06/10/2021   Pneumococcal Conjugate-13 12/06/2013   Pneumococcal Polysaccharide-23 09/23/2011   Td 05/18/2007   Tdap 10/31/2017   Zoster Recombinant(Shingrix) 11/09/2020, 03/17/2021   Zoster, Live 06/29/2012    TDAP status: Up to date  Flu Vaccine status: Due, Education has been provided regarding the importance of this vaccine. Advised may receive this vaccine at local pharmacy or Health Dept. Aware to provide a copy of the vaccination record if obtained from local pharmacy or Health Dept. Verbalized acceptance and understanding.  Pneumococcal vaccine status: Up to date  Covid-19 vaccine status: Completed vaccines  Qualifies for Shingles Vaccine? Yes   Zostavax completed Yes   Shingrix Completed?: Yes  Screening Tests Health Maintenance  Topic Date Due   COVID-19 Vaccine (7 - 2023-24 season) 07/01/2022   INFLUENZA VACCINE  03/16/2023   Hepatitis C Screening  09/17/2023 (Originally 07/26/1964)   MAMMOGRAM  04/06/2023   Medicare Annual Wellness (AWV)  03/29/2024   DTaP/Tdap/Td (3 - Td or Tdap) 11/01/2027   Pneumonia Vaccine 20+ Years old  Completed   DEXA SCAN  Completed   Zoster Vaccines- Shingrix  Completed   HPV VACCINES  Aged Out   Colonoscopy  Discontinued    Health Maintenance  Health Maintenance Due  Topic Date Due   COVID-19 Vaccine (7 - 2023-24 season) 07/01/2022   INFLUENZA VACCINE  03/16/2023    Colorectal cancer screening: No longer required.   Mammogram status: scheduled for 04/12/2023  Bone Density status: Completed 09/29/2021. Results reflect: Bone density results: OSTEOPENIA. Repeat every 2 years.  Lung Cancer Screening: (Low Dose CT Chest recommended if Age 35-80 years, 20 pack-year currently smoking OR have quit w/in 15years.) does not qualify.   Lung Cancer Screening Referral: no  Additional Screening:  Hepatitis C  Screening: does not qualify;   Vision Screening: Recommended annual ophthalmology exams for early detection of glaucoma and other disorders of the eye. Is the patient up to date with their annual eye exam?  Yes  Who is the provider or what is the name of the office in which the patient attends annual eye exams? [facility] If pt is not established with a provider, would they like to be referred to a provider to establish care? No .   Dental Screening: Recommended annual dental exams for proper oral hygiene  Diabetic Foot Exam: n/a  Community Resource Referral / Chronic Care Management: CRR required this visit?  No   CCM required this visit?  No     Plan:     I have personally reviewed and noted the following in the patient's  chart:   Medical and social history Use of alcohol, tobacco or illicit drugs  Current medications and supplements including opioid prescriptions. Patient is not currently taking opioid prescriptions. Functional ability and status Nutritional status Physical activity Advanced directives List of other physicians Hospitalizations, surgeries, and ER visits in previous 12 months Vitals Screenings to include cognitive, depression, and falls Referrals and appointments  In addition, I have reviewed and discussed with patient certain preventive protocols, quality metrics, and best practice recommendations. A written personalized care plan for preventive services as well as general preventive health recommendations were provided to patient.     Barb Merino, LPN   1/61/[id number]   After Visit Summary: (MyChart) Due to this being a telephonic visit, the after visit summary with patients personalized plan was offered to patient via MyChart   Nurse Notes: none

## 2023-03-30 NOTE — Patient Instructions (Signed)
Melanie Marshall , Thank you for taking time to come for your Medicare Wellness Visit. I appreciate your ongoing commitment to your health goals. Please review the following plan we discussed and let me know if I can assist you in the future.   Referrals/Orders/Follow-Ups/Clinician Recommendations: none  This is a list of the screening recommended for you and due dates:  Health Maintenance  Topic Date Due   COVID-19 Vaccine (7 - 2023-24 season) 07/01/2022   Flu Shot  03/16/2023   Hepatitis C Screening  09/17/2023*   Mammogram  04/06/2023   Medicare Annual Wellness Visit  03/29/2024   DTaP/Tdap/Td vaccine (3 - Td or Tdap) 11/01/2027   Pneumonia Vaccine  Completed   DEXA scan (bone density measurement)  Completed   Zoster (Shingles) Vaccine  Completed   HPV Vaccine  Aged Out   Colon Cancer Screening  Discontinued  *Topic was postponed. The date shown is not the original due date.    Advanced directives: (Copy Requested) Please bring a copy of your health care power of attorney and living will to the office to be added to your chart at your convenience.  Next Medicare Annual Wellness Visit scheduled for next year: Yes  Preventive Care 77 Years and Older, Female Preventive care refers to lifestyle choices and visits with your health care provider that can promote health and wellness. What does preventive care include? A yearly physical exam. This is also called an annual well check. Dental exams once or twice a year. Routine eye exams. Ask your health care provider how often you should have your eyes checked. Personal lifestyle choices, including: Daily care of your teeth and gums. Regular physical activity. Eating a healthy diet. Avoiding tobacco and drug use. Limiting alcohol use. Practicing safe sex. Taking low-dose aspirin every day. Taking vitamin and mineral supplements as recommended by your health care provider. What happens during an annual well check? The services and  screenings done by your health care provider during your annual well check will depend on your age, overall health, lifestyle risk factors, and family history of disease. Counseling  Your health care provider may ask you questions about your: Alcohol use. Tobacco use. Drug use. Emotional well-being. Home and relationship well-being. Sexual activity. Eating habits. History of falls. Memory and ability to understand (cognition). Work and work Astronomer. Reproductive health. Screening  You may have the following tests or measurements: Height, weight, and BMI. Blood pressure. Lipid and cholesterol levels. These may be checked every 5 years, or more frequently if you are over 51 years old. Skin check. Lung cancer screening. You may have this screening every year starting at age 44 if you have a 30-pack-year history of smoking and currently smoke or have quit within the past 15 years. Fecal occult blood test (FOBT) of the stool. You may have this test every year starting at age 13. Flexible sigmoidoscopy or colonoscopy. You may have a sigmoidoscopy every 5 years or a colonoscopy every 10 years starting at age 26. Hepatitis C blood test. Hepatitis B blood test. Sexually transmitted disease (STD) testing. Diabetes screening. This is done by checking your blood sugar (glucose) after you have not eaten for a while (fasting). You may have this done every 1-3 years. Bone density scan. This is done to screen for osteoporosis. You may have this done starting at age 5. Mammogram. This may be done every 1-2 years. Talk to your health care provider about how often you should have regular mammograms. Talk with your health  care provider about your test results, treatment options, and if necessary, the need for more tests. Vaccines  Your health care provider may recommend certain vaccines, such as: Influenza vaccine. This is recommended every year. Tetanus, diphtheria, and acellular pertussis (Tdap,  Td) vaccine. You may need a Td booster every 10 years. Zoster vaccine. You may need this after age 70. Pneumococcal 13-valent conjugate (PCV13) vaccine. One dose is recommended after age 35. Pneumococcal polysaccharide (PPSV23) vaccine. One dose is recommended after age 48. Talk to your health care provider about which screenings and vaccines you need and how often you need them. This information is not intended to replace advice given to you by your health care provider. Make sure you discuss any questions you have with your health care provider. Document Released: 08/28/2015 Document Revised: 04/20/2016 Document Reviewed: 06/02/2015 Elsevier Interactive Patient Education  2017 [employer].  Fall Prevention in the Home Falls can cause injuries. They can happen to people of all ages. There are many things you can do to make your home safe and to help prevent falls. What can I do on the outside of my home? Regularly fix the edges of walkways and driveways and fix any cracks. Remove anything that might make you trip as you walk through a door, such as a raised step or threshold. Trim any bushes or trees on the path to your home. Use bright outdoor lighting. Clear any walking paths of anything that might make someone trip, such as rocks or tools. Regularly check to see if handrails are loose or broken. Make sure that both sides of any steps have handrails. Any raised decks and porches should have guardrails on the edges. Have any leaves, snow, or ice cleared regularly. Use sand or salt on walking paths during winter. Clean up any spills in your garage right away. This includes oil or grease spills. What can I do in the bathroom? Use night lights. Install grab bars by the toilet and in the tub and shower. Do not use towel bars as grab bars. Use non-skid mats or decals in the tub or shower. If you need to sit down in the shower, use a plastic, non-slip stool. Keep the floor dry. Clean up any  water that spills on the floor as soon as it happens. Remove soap buildup in the tub or shower regularly. Attach bath mats securely with double-sided non-slip rug tape. Do not have throw rugs and other things on the floor that can make you trip. What can I do in the bedroom? Use night lights. Make sure that you have a light by your bed that is easy to reach. Do not use any sheets or blankets that are too big for your bed. They should not hang down onto the floor. Have a firm chair that has side arms. You can use this for support while you get dressed. Do not have throw rugs and other things on the floor that can make you trip. What can I do in the kitchen? Clean up any spills right away. Avoid walking on wet floors. Keep items that you use a lot in easy-to-reach places. If you need to reach something above you, use a strong step stool that has a grab bar. Keep electrical cords out of the way. Do not use floor polish or wax that makes floors slippery. If you must use wax, use non-skid floor wax. Do not have throw rugs and other things on the floor that can make you trip. What can  I do with my stairs? Do not leave any items on the stairs. Make sure that there are handrails on both sides of the stairs and use them. Fix handrails that are broken or loose. Make sure that handrails are as long as the stairways. Check any carpeting to make sure that it is firmly attached to the stairs. Fix any carpet that is loose or worn. Avoid having throw rugs at the top or bottom of the stairs. If you do have throw rugs, attach them to the floor with carpet tape. Make sure that you have a light switch at the top of the stairs and the bottom of the stairs. If you do not have them, ask someone to add them for you. What else can I do to help prevent falls? Wear shoes that: Do not have high heels. Have rubber bottoms. Are comfortable and fit you well. Are closed at the toe. Do not wear sandals. If you use a  stepladder: Make sure that it is fully opened. Do not climb a closed stepladder. Make sure that both sides of the stepladder are locked into place. Ask someone to hold it for you, if possible. Clearly mark and make sure that you can see: Any grab bars or handrails. First and last steps. Where the edge of each step is. Use tools that help you move around (mobility aids) if they are needed. These include: Canes. Walkers. Scooters. Crutches. Turn on the lights when you go into a dark area. Replace any light bulbs as soon as they burn out. Set up your furniture so you have a clear path. Avoid moving your furniture around. If any of your floors are uneven, fix them. If there are any pets around you, be aware of where they are. Review your medicines with your doctor. Some medicines can make you feel dizzy. This can increase your chance of falling. Ask your doctor what other things that you can do to help prevent falls. This information is not intended to replace advice given to you by your health care provider. Make sure you discuss any questions you have with your health care provider. Document Released: 05/28/2009 Document Revised: 01/07/2016 Document Reviewed: 09/05/2014 Elsevier Interactive Patient Education  2017 [employer].

## 2023-04-03 ENCOUNTER — Other Ambulatory Visit: Payer: Self-pay | Admitting: Family Medicine

## 2023-04-04 ENCOUNTER — Telehealth: Payer: Self-pay | Admitting: Family Medicine

## 2023-04-04 DIAGNOSIS — I10 Essential (primary) hypertension: Secondary | ICD-10-CM

## 2023-04-04 DIAGNOSIS — E7800 Pure hypercholesterolemia, unspecified: Secondary | ICD-10-CM

## 2023-04-04 NOTE — Telephone Encounter (Signed)
-----   Message from Alvina Chou sent at 03/20/2023 11:02 AM EDT ----- Regarding: lab orders for Wednesday, 8.21.24 Patient is scheduled for CPX labs, please order future labs, Thanks , Camelia Eng

## 2023-04-05 ENCOUNTER — Other Ambulatory Visit (INDEPENDENT_AMBULATORY_CARE_PROVIDER_SITE_OTHER): Payer: Medicare PPO

## 2023-04-05 DIAGNOSIS — I10 Essential (primary) hypertension: Secondary | ICD-10-CM

## 2023-04-05 DIAGNOSIS — E7800 Pure hypercholesterolemia, unspecified: Secondary | ICD-10-CM | POA: Diagnosis not present

## 2023-04-05 LAB — LIPID PANEL
Cholesterol: 156 mg/dL (ref 0–200)
HDL: 45.3 mg/dL (ref >39.00)
LDL Cholesterol: 81 mg/dL (ref 0–99)
NonHDL: 111.17
Total CHOL/HDL Ratio: 3
Triglycerides: 151 mg/dL — ABNORMAL HIGH (ref 0.0–149.0)
VLDL: 30.2 mg/dL (ref 0.0–40.0)

## 2023-04-05 LAB — CBC WITH DIFFERENTIAL/PLATELET
Basophils Absolute: 0 10*3/uL (ref 0.0–0.1)
Basophils Relative: 0.4 % (ref 0.0–3.0)
Eosinophils Absolute: 0.1 10*3/uL (ref 0.0–0.7)
Eosinophils Relative: 2.7 % (ref 0.0–5.0)
HCT: 37.2 % (ref 36.0–46.0)
Hemoglobin: 12.1 g/dL (ref 12.0–15.0)
Lymphocytes Relative: 40.6 % (ref 12.0–46.0)
Lymphs Abs: 2 10*3/uL (ref 0.7–4.0)
MCHC: 32.6 g/dL (ref 30.0–36.0)
MCV: 90.6 fl (ref 78.0–100.0)
Monocytes Absolute: 0.6 10*3/uL (ref 0.1–1.0)
Monocytes Relative: 11.6 % (ref 3.0–12.0)
Neutro Abs: 2.2 10*3/uL (ref 1.4–7.7)
Neutrophils Relative %: 44.7 % (ref 43.0–77.0)
Platelets: 317 10*3/uL (ref 150.0–400.0)
RBC: 4.11 Mil/uL (ref 3.87–5.11)
RDW: 14.1 % (ref 11.5–15.5)
WBC: 4.8 10*3/uL (ref 4.0–10.5)

## 2023-04-05 LAB — COMPREHENSIVE METABOLIC PANEL
ALT: 12 U/L (ref 0–35)
AST: 16 U/L (ref 0–37)
Albumin: 4.3 g/dL (ref 3.5–5.2)
Alkaline Phosphatase: 43 U/L (ref 39–117)
BUN: 32 mg/dL — ABNORMAL HIGH (ref 6–23)
CO2: 27 meq/L (ref 19–32)
Calcium: 9 mg/dL (ref 8.4–10.5)
Chloride: 104 meq/L (ref 96–112)
Creatinine, Ser: 1.1 mg/dL (ref 0.40–1.20)
GFR: 48.75 mL/min — ABNORMAL LOW (ref >60.00)
Glucose, Bld: 102 mg/dL — ABNORMAL HIGH (ref 70–99)
Potassium: 3.7 meq/L (ref 3.5–5.1)
Sodium: 140 mEq/L (ref 135–145)
Total Bilirubin: 0.5 mg/dL (ref 0.2–1.2)
Total Protein: 6.8 g/dL (ref 6.0–8.3)

## 2023-04-07 LAB — TSH: TSH: 1.51 u[IU]/mL (ref 0.35–5.50)

## 2023-04-11 ENCOUNTER — Ambulatory Visit (INDEPENDENT_AMBULATORY_CARE_PROVIDER_SITE_OTHER): Payer: Medicare PPO | Admitting: Family Medicine

## 2023-04-11 ENCOUNTER — Encounter: Payer: Self-pay | Admitting: Family Medicine

## 2023-04-11 ENCOUNTER — Encounter: Payer: Medicare PPO | Admitting: Family Medicine

## 2023-04-11 VITALS — BP 124/66 | HR 71 | Temp 97.8°F | Ht 58.75 in | Wt 159.2 lb

## 2023-04-11 DIAGNOSIS — E876 Hypokalemia: Secondary | ICD-10-CM

## 2023-04-11 DIAGNOSIS — I10 Essential (primary) hypertension: Secondary | ICD-10-CM | POA: Diagnosis not present

## 2023-04-11 DIAGNOSIS — M8589 Other specified disorders of bone density and structure, multiple sites: Secondary | ICD-10-CM

## 2023-04-11 DIAGNOSIS — Z0000 Encounter for general adult medical examination without abnormal findings: Secondary | ICD-10-CM | POA: Diagnosis not present

## 2023-04-11 DIAGNOSIS — E7800 Pure hypercholesterolemia, unspecified: Secondary | ICD-10-CM

## 2023-04-11 MED ORDER — POTASSIUM CHLORIDE ER 10 MEQ PO TBCR: 10.0000 meq | EXTENDED_RELEASE_TABLET | Freq: Every day | ORAL | 3 refills | Status: DC

## 2023-04-11 MED ORDER — HYDROCHLOROTHIAZIDE 25 MG PO TABS: 25.0000 mg | ORAL_TABLET | Freq: Every day | ORAL | 3 refills | Status: DC

## 2023-04-11 MED ORDER — ROSUVASTATIN CALCIUM 5 MG PO TABS: 5.0000 mg | ORAL_TABLET | Freq: Every day | ORAL | 3 refills | Status: DC

## 2023-04-11 NOTE — Assessment & Plan Note (Addendum)
bp in fair control at this time  BP Readings from Last 1 Encounters:  04/11/23 124/66   No changes needed Most recent labs reviewed  Disc lifstyle change with low sodium diet and exercise  Plan to continue hctz 25 mg daily with klor con   Watching renal numbers

## 2023-04-11 NOTE — Patient Instructions (Addendum)
Keep walking  Add some strength training to your routine, this is important for bone and brain health and can reduce your risk of falls and help your body use insulin properly and regulate weight  Light weights, exercise bands , and internet videos are a good way to start  Yoga (chair or regular), machines , floor exercises or a gym with machines are also good options    For kindey health try and get 60 oz of fluid per day    Eat a healthy diet low in processed food            Mediterranean Diet  Why follow it? Research shows. Those who follow the Mediterranean diet have a reduced risk of heart disease  The diet is associated with a reduced incidence of Parkinson's and Alzheimer's diseases People following the diet may have longer life expectancies and lower rates of chronic diseases  The Dietary Guidelines for Americans recommends the Mediterranean diet as an eating plan to promote health and prevent disease  What Is the Mediterranean Diet?  Healthy eating plan based on typical foods and recipes of Mediterranean-style cooking The diet is primarily a plant based diet; these foods should make up a majority of meals   Starches - Plant based foods should make up a majority of meals - They are an important sources of vitamins, minerals, energy, antioxidants, and fiber - Choose whole grains, foods high in fiber and minimally processed items  - Typical grain sources include wheat, oats, barley, corn, brown rice, bulgar, farro, millet, polenta, couscous  - Various types of beans include chickpeas, lentils, fava beans, black beans, white beans   Fruits  Veggies - Large quantities of antioxidant rich fruits & veggies; 6 or more servings  - Vegetables can be eaten raw or lightly drizzled with oil and cooked  - Vegetables common to the traditional Mediterranean Diet include: artichokes, arugula, beets, broccoli, brussel sprouts, cabbage, carrots, celery, collard greens, cucumbers, eggplant,  kale, leeks, lemons, lettuce, mushrooms, okra, onions, peas, peppers, potatoes, pumpkin, radishes, rutabaga, shallots, spinach, sweet potatoes, turnips, zucchini - Fruits common to the Mediterranean Diet include: apples, apricots, avocados, cherries, clementines, dates, figs, grapefruits, grapes, melons, nectarines, oranges, peaches, pears, pomegranates, strawberries, tangerines  Fats - Replace butter and margarine with healthy oils, such as olive oil, canola oil, and tahini  - Limit nuts to no more than a handful a day  - Nuts include walnuts, almonds, pecans, pistachios, pine nuts  - Limit or avoid candied, honey roasted or heavily salted nuts - Olives are central to the [employer] - can be eaten whole or used in a variety of dishes   Meats Protein - Limiting red meat: no more than a few times a month - When eating red meat: choose lean cuts and keep the portion to the size of deck of cards - Eggs: approx. 0 to 4 times a week  - Fish and lean poultry: at least 2 a week  - Healthy protein sources include, chicken, [place], lean beef, lamb - Increase intake of seafood such as tuna, salmon, trout, mackerel, shrimp, scallops - Avoid or limit high fat processed meats such as sausage and bacon  Dairy - Include moderate amounts of low fat dairy products  - Focus on healthy dairy such as fat free yogurt, skim milk, low or reduced fat cheese - Limit dairy products higher in fat such as whole or 2% milk, cheese, ice cream  Alcohol - Moderate amounts of red wine is  ok  - No more than 5 oz daily for women (all ages) and men older than age 37  - No more than 10 oz of wine daily for men younger than 62  Other - Limit sweets and other desserts  - Use herbs and spices instead of salt to flavor foods  - Herbs and spices common to the traditional Mediterranean Diet include: basil, bay leaves, chives, cloves, cumin, fennel, garlic, lavender, marjoram, mint, oregano, parsley, pepper, rosemary, sage,  savory, sumac, tarragon, thyme   It's not just a diet, it's a lifestyle:  The Mediterranean diet includes lifestyle factors typical of those in the region  Foods, drinks and meals are best eaten with others and savored Daily physical activity is important for overall good health This could be strenuous exercise like running and aerobics This could also be more leisurely activities such as walking, housework, yard-work, or taking the stairs Moderation is the key; a balanced and healthy diet accommodates most foods and drinks Consider portion sizes and frequency of consumption of certain foods   Meal Ideas & Options:  Breakfast:  Whole wheat toast or whole wheat English muffins with peanut butter & hard boiled egg Steel cut oats topped with apples & cinnamon and skim milk  Fresh fruit: banana, strawberries, melon, berries, peaches  Smoothies: strawberries, bananas, greek yogurt, peanut butter Low fat greek yogurt with blueberries and granola  Egg white omelet with spinach and mushrooms Breakfast couscous: whole wheat couscous, apricots, skim milk, cranberries  Sandwiches:  Hummus and grilled vegetables (peppers, zucchini, squash) on whole wheat bread   Grilled chicken on whole wheat pita with lettuce, tomatoes, cucumbers or tzatziki  [place] salad on whole wheat bread: tuna salad made with greek yogurt, olives, red peppers, capers, green onions Garlic rosemary lamb pita: lamb sauted with garlic, rosemary, salt & pepper; add lettuce, cucumber, greek yogurt to pita - flavor with lemon juice and black pepper  Seafood:  Mediterranean grilled salmon, seasoned with garlic, basil, parsley, lemon juice and black pepper Shrimp, lemon, and spinach whole-grain pasta salad made with low fat greek yogurt  Seared scallops with lemon orzo  Seared tuna steaks seasoned salt, pepper, coriander topped with tomato mixture of olives, tomatoes, olive oil, minced garlic, parsley, green onions and cappers  Meats:   Herbed greek chicken salad with kalamata olives, cucumber, feta  Red bell peppers stuffed with spinach, bulgur, lean ground beef (or lentils) & topped with feta   Kebabs: skewers of chicken, tomatoes, onions, zucchini, squash  [place] burgers: made with red onions, mint, dill, lemon juice, feta cheese topped with roasted red peppers Vegetarian Cucumber salad: cucumbers, artichoke hearts, celery, red onion, feta cheese, tossed in olive oil & lemon juice  Hummus and whole grain pita points with a greek salad (lettuce, tomato, feta, olives, cucumbers, red onion) Lentil soup with celery, carrots made with vegetable broth, garlic, salt and pepper  Tabouli salad: parsley, bulgur, mint, scallions, cucumbers, tomato, radishes, lemon juice, olive oil, salt and pepper.

## 2023-04-11 NOTE — Progress Notes (Signed)
Subjective:    Patient ID: Melanie Marshall, female    DOB: 11-Aug-1946, 77 y.o.   MRN: [id number]  HPI  Here for health maintenance exam and to review chronic medical problems   Wt Readings from Last 3 Encounters:  04/11/23 159 lb 4 oz (72.2 kg)  06/01/22 159 lb 8 oz (72.3 kg)  04/06/22 158 lb (71.7 kg)   32.44 kg/m  Vitals:   04/11/23 1120  BP: 124/66  Pulse: 71  Temp: 97.8 F (36.6 C)  SpO2: 96%    Immunization History  Administered Date(s) Administered   Fluad Quad(high Dose 65+) 04/25/2019, 05/06/2022   Hepatitis A 06/24/2008   Influenza Split 06/10/2011   Influenza Whole 07/23/2010   Influenza, High Dose Seasonal PF 05/08/2015, 05/16/2016, 05/11/2020, 06/10/2021   Influenza,inj,Quad PF,6+ Mos 04/18/2018   Influenza-Unspecified 05/15/2013, 05/16/2014, 05/08/2017   PFIZER Comirnaty(Gray Top)Covid-19 Tri-Sucrose Vaccine 12/14/2020, 05/06/2022   PFIZER(Purple Top)SARS-COV-2 Vaccination 10/18/2019, 11/08/2019, 05/11/2020   Pfizer Covid-19 Vaccine Bivalent Booster 30yrs & up 06/10/2021   Pneumococcal Conjugate-13 12/06/2013   Pneumococcal Polysaccharide-23 09/23/2011   Td 05/18/2007   Tdap 10/31/2017   Zoster Recombinant(Shingrix) 11/09/2020, 03/17/2021   Zoster, Live 06/29/2012    Health Maintenance Due  Topic Date Due   MAMMOGRAM  04/06/2023   Plans to get flu shot later in the fall   Mammogram is scheduled tomorrow at the [facility]  Self breast exam: no lumps   Gyn health: no problems   Fam history of pancreatic cancer- father and possibly brother   Colon cancer screening -colonoscopy 12/2021    Bone health  Dexa  09/2021 stable osteopenia  Falls- none Fractures-none  Supplements -vitamin D 3  Ex[facility] : [facility] [employer] ([facility]  Walking  Wants to get back to some strength training   Mood    04/11/2023   11:24 AM 03/30/2023   12:33 PM 04/07/2022   11:07 AM 04/06/2022    8:53 AM 04/05/2021    8:22 AM  Depression screen  PHQ 2/9  Decreased Interest 0 0 0 0 0  Down, Depressed, Hopeless 0 0 0 0 0  PHQ - 2 Score 0 0 0 0 0  Altered sleeping 1 0     Tired, decreased energy 0 0     Change in appetite 0 0     Feeling bad or failure about yourself  0 0     Trouble concentrating 0 0     Moving slowly or fidgety/restless 0 0     Suicidal thoughts 0 0     PHQ-9 Score 1 0     Difficult doing work/chores Not difficult at all Not difficult at all      She had a stressful summer  Partner had chemo and radiation -this is done   Stress eating  Convenience eating   Getting back to normal     HTN bp is stable today  No cp or palpitations or headaches or edema  No side effects to medicines  BP Readings from Last 3 Encounters:  04/11/23 124/66  06/01/22 122/72  04/06/22 122/78    Hydrochlorothiazide 25 mg daily  Klor con   Lab Results  Component Value Date   NA 140 04/05/2023   K 3.7 04/05/2023   CO2 27 04/05/2023   GLUCOSE 102 (H) 04/05/2023   BUN 32 (H) 04/05/2023   CREATININE 1.10 04/05/2023   CALCIUM 9.0 04/05/2023   GFR 48.75 (L) 04/05/2023   GFRNONAA 81.44  07/20/2010   Needs to drink more fluid    Hyperlipidemia Lab Results  Component Value Date   CHOL 156 04/05/2023   CHOL 138 05/04/2022   CHOL 229 (H) 03/30/2022   Lab Results  Component Value Date   HDL 45.30 04/05/2023   HDL 52.20 05/04/2022   HDL 43.90 03/30/2022   Lab Results  Component Value Date   LDLCALC 81 04/05/2023   LDLCALC 66 05/04/2022   LDLCALC 139 (H) 04/05/2021   Lab Results  Component Value Date   TRIG 151.0 (H) 04/05/2023   TRIG 97.0 05/04/2022   TRIG 242.0 (H) 03/30/2022   Lab Results  Component Value Date   CHOLHDL 3 04/05/2023   CHOLHDL 3 05/04/2022   CHOLHDL 5 03/30/2022   Lab Results  Component Value Date   LDLDIRECT 145.0 03/30/2022   LDLDIRECT 141.0 03/28/2019   LDLDIRECT 138.8 02/21/2013   Taking crestor 5 mg daily   More sugar and fat in diet lately   Lab Results  Component  Value Date   TSH 1.51 04/05/2023   Lab Results  Component Value Date   ALT 12 04/05/2023   AST 16 04/05/2023   ALKPHOS 43 04/05/2023   BILITOT 0.5 04/05/2023   Lab Results  Component Value Date   WBC 4.8 04/05/2023   HGB 12.1 04/05/2023   HCT 37.2 04/05/2023   MCV 90.6 04/05/2023   PLT 317.0 04/05/2023     Patient Active Problem List   Diagnosis Date Noted   Hypokalemia 04/01/2019   Essential hypertension 03/14/2017   Estrogen deficiency 03/09/2015   Routine general medical examination at a health care facility 03/01/2015   Hyperlipidemia 06/24/2008   ALLERGIC RHINITIS 04/17/2007   Osteopenia 04/17/2007   MURMUR 04/17/2007   MIGRAINES, HX OF 04/17/2007   Past Medical History:  Diagnosis Date   Allergy    allergic rhinitis   Arthritis    Heart murmur    not treated   History of blood transfusion 08/15/1968   after childbirth   Hyperlipidemia    Hypertension    Osteopenia    Past Surgical History:  Procedure Laterality Date   COLONOSCOPY     TUBAL LIGATION  08/15/1977   Social History   Tobacco Use   Smoking status: Former    Current packs/day: 0.00    Types: Cigarettes    Quit date: 08/16/1967    Years since quitting: 55.6   Smokeless tobacco: Never  Vaping Use   Vaping status: Never Used  Substance Use Topics   Alcohol use: Yes    Alcohol/week: 1.0 standard drink of alcohol    Types: 1 Glasses of wine per week    Comment: occ   Drug use: No   Family History  Problem Relation Age of Onset   Diabetes Mother    Heart disease Mother        MI   Hypertension Father        ??HTN   Hyperlipidemia Father    Pancreatic cancer Father    Cancer Father    Hypertension Brother    Cancer Brother        pancreastic (suspect)    Diabetes Brother    Breast cancer Maternal Grandmother    Cancer Paternal Grandmother        breast CA   Cancer Brother    COPD Brother    Diabetes Daughter    Colon cancer Neg Hx    Stomach cancer Neg Hx    Colon polyps  Neg Hx    Esophageal cancer Neg Hx    Rectal cancer Neg Hx    No Known Allergies Current Outpatient Medications on File Prior to Visit  Medication Sig Dispense Refill   Cholecalciferol (VITAMIN D3) 2000 units TABS Take 1 tablet by mouth daily.     ibuprofen (ADVIL,MOTRIN) 200 MG tablet Take 200 mg by mouth every 6 (six) hours as needed.     Multiple Vitamins-Minerals (PRESERVISION AREDS PO) Take 1 tablet by mouth 2 (two) times daily.     No current facility-administered medications on file prior to visit.    Review of Systems  Constitutional:  Negative for activity change, appetite change, fatigue, fever and unexpected weight change.  HENT:  Negative for congestion, ear pain, rhinorrhea, sinus pressure and sore throat.   Eyes:  Negative for pain, redness and visual disturbance.  Respiratory:  Negative for cough, shortness of breath and wheezing.   Cardiovascular:  Negative for chest pain and palpitations.  Gastrointestinal:  Negative for abdominal pain, blood in stool, constipation and diarrhea.  Endocrine: Negative for polydipsia and polyuria.  Genitourinary:  Negative for dysuria, frequency and urgency.  Musculoskeletal:  Positive for arthralgias. Negative for back pain and myalgias.  Skin:  Negative for pallor and rash.  Allergic/Immunologic: Negative for environmental allergies.  Neurological:  Negative for dizziness, syncope and headaches.  Hematological:  Negative for adenopathy. Does not bruise/bleed easily.  Psychiatric/Behavioral:  Negative for decreased concentration and dysphoric mood. The patient is not nervous/anxious.        Objective:   Physical Exam Constitutional:      General: She is not in acute distress.    Appearance: Normal appearance. She is well-developed. She is obese. She is not ill-appearing or diaphoretic.  HENT:     Head: Normocephalic and atraumatic.     Right Ear: Tympanic membrane, ear canal and external ear normal.     Left Ear: Tympanic  membrane, ear canal and external ear normal.     Nose: Nose normal. No congestion.     Mouth/Throat:     Mouth: Mucous membranes are moist.     Pharynx: Oropharynx is clear. No posterior oropharyngeal erythema.  Eyes:     General: No scleral icterus.    Extraocular Movements: Extraocular movements intact.     Conjunctiva/sclera: Conjunctivae normal.     Pupils: Pupils are equal, round, and reactive to light.  Neck:     Thyroid: No thyromegaly.     Vascular: No carotid bruit or JVD.  Cardiovascular:     Rate and Rhythm: Normal rate and regular rhythm.     Pulses: Normal pulses.     Heart sounds: Normal heart sounds.     No gallop.  Pulmonary:     Effort: Pulmonary effort is normal. No respiratory distress.     Breath sounds: Normal breath sounds. No wheezing.     Comments: Good air exch Chest:     Chest wall: No tenderness.  Abdominal:     General: Bowel sounds are normal. There is no distension or abdominal bruit.     Palpations: Abdomen is soft. There is no mass.     Tenderness: There is no abdominal tenderness.     Hernia: No hernia is present.  Genitourinary:    Comments: Breast exam: No mass, nodules, thickening, tenderness, bulging, retraction, inflamation, nipple discharge or skin changes noted.  No axillary or clavicular LA.     Musculoskeletal:        General: No tenderness.  Normal range of motion.     Cervical back: Normal range of motion and neck supple. No rigidity. No muscular tenderness.     Right lower leg: No edema.     Left lower leg: No edema.     Comments: No kyphosis   Lymphadenopathy:     Cervical: No cervical adenopathy.  Skin:    General: Skin is warm and dry.     Coloration: Skin is not pale.     Findings: No erythema or rash.     Comments: Solar lentigines diffusely  Sks scattered   Neurological:     Mental Status: She is alert. Mental status is at baseline.     Cranial Nerves: No cranial nerve deficit.     Motor: No abnormal muscle tone.      Coordination: Coordination normal.     Gait: Gait normal.     Deep Tendon Reflexes: Reflexes are normal and symmetric. Reflexes normal.  Psychiatric:        Mood and Affect: Mood normal.        Cognition and Memory: Cognition and memory normal.           Assessment & Plan:   Problem List Items Addressed This Visit       Cardiovascular and Mediastinum   Essential hypertension    bp in fair control at this time  BP Readings from Last 1 Encounters:  04/11/23 124/66   No changes needed Most recent labs reviewed  Disc lifstyle change with low sodium diet and exercise  Plan to continue hctz 25 mg daily with klor con   Watching renal numbers       Relevant Medications   hydrochlorothiazide (HYDRODIURIL) 25 MG tablet   rosuvastatin (CRESTOR) 5 MG tablet     Musculoskeletal and Integument   Osteopenia    Next dexa is due in feb 2025  No falls or fracture On D3 Getting back to strength building exercise         Other   Hyperlipidemia    Disc goals for lipids and reasons to control them Rev last labs with pt Rev low sat fat diet in detail  LDL of 81 Continues crestor 5 mg daily      Relevant Medications   hydrochlorothiazide (HYDRODIURIL) 25 MG tablet   rosuvastatin (CRESTOR) 5 MG tablet   Hypokalemia    Lab Results  Component Value Date   K 3.7 04/05/2023   Takes hydrochlorothiazide  On klor con 10 meq daily       Routine general medical examination at a health care facility - Primary    Reviewed health habits including diet and exercise and skin cancer prevention Reviewed appropriate screening tests for age  Also reviewed health mt list, fam hx and immunization status , as well as social and family history   See HPI Labs reviewed and ordered Plans flu shot in the fall  Mammogram scheduled tomorrow  Colonoscopy 12/2021  Dexa 09/2021 , no falls or fracture, counseled re: strength building exercise PHQ 1

## 2023-04-11 NOTE — Assessment & Plan Note (Signed)
Reviewed health habits including diet and exercise and skin cancer prevention Reviewed appropriate screening tests for age  Also reviewed health mt list, fam hx and immunization status , as well as social and family history   See HPI Labs reviewed and ordered Plans flu shot in the fall  Mammogram scheduled tomorrow  Colonoscopy 12/2021  Dexa 09/2021 , no falls or fracture, counseled re: strength building exercise PHQ 1

## 2023-04-11 NOTE — Assessment & Plan Note (Signed)
Lab Results  Component Value Date   K 3.7 04/05/2023   Takes hydrochlorothiazide  On klor con 10 meq daily

## 2023-04-11 NOTE — Assessment & Plan Note (Signed)
Next dexa is due in feb 2025  No falls or fracture On D3 Getting back to strength building exercise

## 2023-04-11 NOTE — Assessment & Plan Note (Signed)
Disc goals for lipids and reasons to control them Rev last labs with pt Rev low sat fat diet in detail  LDL of 81 Continues crestor 5 mg daily

## 2023-04-12 ENCOUNTER — Ambulatory Visit
Admission: RE | Admit: 2023-04-12 | Discharge: 2023-04-12 | Disposition: A | Discharge status: Home / Self Care | Payer: Medicare PPO | Source: Ambulatory Visit | Attending: Family Medicine | Admitting: Family Medicine

## 2023-04-12 DIAGNOSIS — Z1231 Encounter for screening mammogram for malignant neoplasm of breast: Secondary | ICD-10-CM | POA: Diagnosis not present

## 2023-12-06 DIAGNOSIS — H0100A Unspecified blepharitis right eye, upper and lower eyelids: Secondary | ICD-10-CM | POA: Diagnosis not present

## 2023-12-06 DIAGNOSIS — H35033 Hypertensive retinopathy, bilateral: Secondary | ICD-10-CM | POA: Diagnosis not present

## 2023-12-06 DIAGNOSIS — B880 Other acariasis: Secondary | ICD-10-CM | POA: Diagnosis not present

## 2023-12-06 DIAGNOSIS — H353132 Nonexudative age-related macular degeneration, bilateral, intermediate dry stage: Secondary | ICD-10-CM | POA: Diagnosis not present

## 2023-12-06 DIAGNOSIS — H25813 Combined forms of age-related cataract, bilateral: Secondary | ICD-10-CM | POA: Diagnosis not present

## 2023-12-06 DIAGNOSIS — H0100B Unspecified blepharitis left eye, upper and lower eyelids: Secondary | ICD-10-CM | POA: Diagnosis not present

## 2023-12-06 DIAGNOSIS — H04123 Dry eye syndrome of bilateral lacrimal glands: Secondary | ICD-10-CM | POA: Diagnosis not present

## 2023-12-28 ENCOUNTER — Other Ambulatory Visit: Payer: Self-pay | Admitting: Family Medicine

## 2024-02-07 DIAGNOSIS — H0100A Unspecified blepharitis right eye, upper and lower eyelids: Secondary | ICD-10-CM | POA: Diagnosis not present

## 2024-02-07 DIAGNOSIS — B880 Other acariasis: Secondary | ICD-10-CM | POA: Diagnosis not present

## 2024-02-07 DIAGNOSIS — H0100B Unspecified blepharitis left eye, upper and lower eyelids: Secondary | ICD-10-CM | POA: Diagnosis not present

## 2024-02-28 ENCOUNTER — Other Ambulatory Visit: Payer: Self-pay | Admitting: Family Medicine

## 2024-02-28 DIAGNOSIS — Z0000 Encounter for general adult medical examination without abnormal findings: Secondary | ICD-10-CM

## 2024-04-08 ENCOUNTER — Ambulatory Visit (INDEPENDENT_AMBULATORY_CARE_PROVIDER_SITE_OTHER): Payer: Medicare PPO

## 2024-04-08 VITALS — BP 124/66 | Ht 58.75 in | Wt 159.0 lb

## 2024-04-08 DIAGNOSIS — Z0000 Encounter for general adult medical examination without abnormal findings: Secondary | ICD-10-CM | POA: Diagnosis not present

## 2024-04-08 NOTE — Progress Notes (Signed)
 Because this visit was a virtual/telehealth visit,  certain criteria was not obtained, such a blood pressure, CBG if applicable, and timed get up and go. Any medications not marked as taking were not mentioned during the medication reconciliation part of the visit. Any vitals not documented were not able to be obtained due to this being a telehealth visit or patient was unable to self-report a recent blood pressure reading due to a lack of equipment at home via telehealth. Vitals that have been documented are verbally provided by the patient.  This visit was performed by a medical professional under my direct supervision. I was immediately available for consultation/collaboration. I have reviewed and agree with the Annual Wellness Visit documentation.  Subjective:   Melanie Marshall is a 78 y.o. who presents for a Medicare Wellness preventive visit.  As a reminder, Annual Wellness Visits don't include a physical exam, and some assessments may be limited, especially if this visit is performed virtually. We may recommend an in-person follow-up visit with your provider if needed.  Visit Complete: Virtual I connected with  Melanie Marshall on 04/08/24 by a audio enabled telemedicine application and verified that I am speaking with the correct person using two identifiers.  Patient Location: Home  Provider Location: Home Office  I discussed the limitations of evaluation and management by telemedicine. The patient expressed understanding and agreed to proceed.  Vital Signs: Because this visit was a virtual/telehealth visit, some criteria may be missing or patient reported. Any vitals not documented were not able to be obtained and vitals that have been documented are patient reported.  VideoDeclined- This patient declined Librarian, academic. Therefore the visit was completed with audio only.  Persons Participating in Visit: Patient.  AWV Questionnaire: Yes: Patient Medicare  AWV questionnaire was completed by the patient on 04/07/2024; I have confirmed that all information answered by patient is correct and no changes since this date.  Cardiac Risk Factors include: advanced age (>51men, >63 women);obesity (BMI >30kg/m2);hypertension;dyslipidemia     Objective:    Today's Vitals   04/08/24 1151  BP: 124/66  Weight: 159 lb (72.1 kg)  Height: 4' 10.75 (1.492 m)   Body mass index is 32.39 kg/m.     04/08/2024   11:51 AM 03/30/2023   12:31 PM 04/07/2022   11:01 AM 04/01/2020    9:06 AM 03/26/2018    8:19 AM 03/10/2016    8:47 AM  Advanced Directives  Does Patient Have a Medical Advance Directive? Yes Yes Yes Yes Yes  Yes   Type of Estate agent of [place];Living will Healthcare Power of [place];Living will Healthcare Power of [employer] of [place];Living will Healthcare Power of [place];Living will Living will;Healthcare Power of Attorney   Does patient want to make changes to medical advance directive? No - Patient declined     No - Patient declined   Copy of Healthcare Power of Attorney in Chart? No - copy requested No - copy requested Yes - validated most recent copy scanned in chart (See row information) No - copy requested Yes  Yes      Data saved with a previous flowsheet row definition    Current Medications (verified) Outpatient Encounter Medications as of 04/08/2024  Medication Sig   Cholecalciferol (VITAMIN D3) 2000 units TABS Take 1 tablet by mouth daily.   hydrochlorothiazide  (HYDRODIURIL ) 25 MG tablet Take 1 tablet (25 mg total) by mouth daily.   ibuprofen (ADVIL,MOTRIN) 200 MG tablet Take 200 mg  by mouth every 6 (six) hours as needed.   Multiple Vitamins-Minerals (PRESERVISION AREDS PO) Take 1 tablet by mouth 2 (two) times daily.   rosuvastatin  (CRESTOR ) 5 MG tablet Take 1 tablet (5 mg total) by mouth daily.   potassium chloride  (KLOR-CON ) 10 MEQ tablet Take 1 tablet (10 mEq total) by mouth daily.   No  facility-administered encounter medications on file as of 04/08/2024.    Allergies (verified) Patient has no known allergies.   History: Past Medical History:  Diagnosis Date   Allergy    allergic rhinitis   Arthritis    Heart murmur    not treated   History of blood transfusion 08/15/1968   after childbirth   Hyperlipidemia    Hypertension    Osteopenia    Past Surgical History:  Procedure Laterality Date   COLONOSCOPY     TUBAL LIGATION  08/15/1977   Family History  Problem Relation Age of Onset   Diabetes Mother    Heart disease Mother        MI   Hypertension Father        ??HTN   Hyperlipidemia Father    Pancreatic cancer Father    Cancer Father    Hypertension Brother    Cancer Brother        pancreastic (suspect)    Diabetes Brother    Breast cancer Maternal Grandmother    Cancer Paternal Grandmother        breast CA   Cancer Brother    COPD Brother    Diabetes Daughter    Colon cancer Neg Hx    Stomach cancer Neg Hx    Colon polyps Neg Hx    Esophageal cancer Neg Hx    Rectal cancer Neg Hx    Social History   Socioeconomic History   Marital status: Widowed    Spouse name: Not on file   Number of children: Not on file   Years of education: Not on file   Highest education level: Some college, no degree  Occupational History   Not on file  Tobacco Use   Smoking status: Former    Current packs/day: 0.00    Types: Cigarettes    Quit date: 08/16/1967    Years since quitting: 56.6   Smokeless tobacco: Never  Vaping Use   Vaping status: Never Used  Substance and Sexual Activity   Alcohol use: Yes    Alcohol/week: 1.0 standard drink of alcohol    Types: 1 Glasses of wine per week    Comment: occ   Drug use: No   Sexual activity: Yes    Birth control/protection: Post-menopausal  Other Topics Concern   Not on file  Social History Narrative   Not on file   Social Drivers of Health   Financial Resource Strain: Low Risk  (04/07/2024)    Overall Financial Resource Strain (CARDIA)    Difficulty of Paying Living Expenses: Not hard at all  Food Insecurity: No Food Insecurity (04/07/2024)   Hunger Vital Sign    Worried About Running Out of Food in the Last Year: Never true    Ran Out of Food in the Last Year: Never true  Transportation Needs: No Transportation Needs (04/07/2024)   PRAPARE - Administrator, Civil Service (Medical): No    Lack of Transportation (Non-Medical): No  Physical Activity: Insufficiently Active (04/07/2024)   Exercise Vital Sign    Days of Exercise per Week: 2 days    Minutes of  Exercise per Session: 30 min  Stress: No Stress Concern Present (04/07/2024)   [employer] of Occupational Health - Occupational Stress Questionnaire    Feeling of Stress: Only a little  Social Connections: Moderately Integrated (04/07/2024)   Social Connection and Isolation Panel    Frequency of Communication with Friends and Family: More than three times a week    Frequency of Social Gatherings with Friends and Family: Twice a week    Attends Religious Services: More than 4 times per year    Active Member of [employer] or Organizations: Yes    Attends Banker Meetings: More than 4 times per year    Marital Status: Widowed    Tobacco Counseling Counseling given: Not Answered    Clinical Intake:  Pre-visit preparation completed: Yes  Pain : No/denies pain     BMI - recorded: 32.39 Nutritional Status: BMI > 30  Obese Nutritional Risks: None Diabetes: No  No results found for: HGBA1C   How often do you need to have someone help you when you read instructions, pamphlets, or other written materials from your doctor or pharmacy?: 1 - Never  Interpreter Needed?: No  Information entered by :: Munirah Doerner,CMA   Activities of Daily Living     04/07/2024    1:38 PM  In your present state of health, do you have any difficulty performing the following activities:  Hearing? 0   Vision? 0  Difficulty concentrating or making decisions? 0  Walking or climbing stairs? 0  Dressing or bathing? 0  Doing errands, shopping? 0  Preparing Food and eating ? N  Using the Toilet? N  In the past six months, have you accidently leaked urine? Y  Do you have problems with loss of bowel control? N  Managing your Medications? N  Managing your Finances? N    Patient Care Team: Tower, Laine LABOR, MD as PCP - General Glendia Ford, DDS as Referring Physician (Dentistry) Cleatus Collar, MD as Consulting Physician (Ophthalmology)  I have updated your Care Teams any recent Medical Services you may have received from other providers in the past year.     Assessment:   This is a routine wellness examination for Deshon.  Hearing/Vision screen Hearing Screening - Comments:: No difficulties  Vision Screening - Comments:: Patient wears glasses   Goals Addressed             This Visit's Progress    Patient Stated       To get a new car        Depression Screen     04/08/2024   11:54 AM 04/11/2023   11:24 AM 03/30/2023   12:33 PM 04/07/2022   11:07 AM 04/06/2022    8:53 AM 04/05/2021    8:22 AM 04/01/2020    9:08 AM  PHQ 2/9 Scores  PHQ - 2 Score 0 0 0 0 0 0 0  PHQ- 9 Score 0 1 0    0    Fall Risk     04/07/2024    1:38 PM 04/11/2023   11:24 AM 03/30/2023   12:32 PM 04/07/2022   11:02 AM 04/06/2022    8:53 AM  Fall Risk   Falls in the past year? 0 0 0 1 1  Number falls in past yr: 0 0 0 0 0  Injury with Fall? 0 0 0 0 0  Risk for fall due to : No Fall Risks No Fall Risks Medication side effect    Follow  up Falls evaluation completed Falls evaluation completed Falls prevention discussed;Falls evaluation completed Falls evaluation completed;Education provided;Falls prevention discussed  Falls evaluation completed      Data saved with a previous flowsheet row definition    MEDICARE RISK AT HOME:  Medicare Risk at Home Any stairs in or around the home?: (Patient-Rptd)  Yes If so, are there any without handrails?: (Patient-Rptd) No Home free of loose throw rugs in walkways, pet beds, electrical cords, etc?: (Patient-Rptd) Yes Adequate lighting in your home to reduce risk of falls?: (Patient-Rptd) Yes Life alert?: (Patient-Rptd) No Use of a cane, walker or w/c?: (Patient-Rptd) No Grab bars in the bathroom?: (Patient-Rptd) Yes Shower chair or bench in shower?: (Patient-Rptd) Yes Elevated toilet seat or a handicapped toilet?: (Patient-Rptd) Yes  TIMED UP AND GO:  Was the test performed?  No  Cognitive Function: 6CIT completed    04/01/2020    9:09 AM 03/26/2018    8:19 AM 03/10/2016    8:47 AM  MMSE - Mini Mental State Exam  Orientation to time 5 5 5    Orientation to Place 5 5 5    Registration 3 3 3    Attention/ Calculation 5 0 0   Recall 3 3 3    Language- name 2 objects  0 0   Language- repeat 1 1 1   Language- follow 3 step command  3 3   Language- read & follow direction  0 0   Write a sentence  0 0   Copy design  0 0   Total score  20 20      Data saved with a previous flowsheet row definition        04/08/2024   11:54 AM 03/30/2023   12:33 PM 04/07/2022   11:03 AM  6CIT Screen  What Year? 0 points 0 points 0 points  What month? 0 points 0 points 0 points  What time? 0 points 0 points 0 points  Count back from 20 0 points 0 points 0 points  Months in reverse 0 points 0 points 0 points  Repeat phrase 0 points 0 points 0 points  Total Score 0 points 0 points 0 points    Immunizations Immunization History  Administered Date(s) Administered   Fluad Quad(high Dose 65+) 04/25/2019, 05/06/2022   Hepatitis A 06/24/2008   INFLUENZA, HIGH DOSE SEASONAL PF 05/08/2015, 05/16/2016, 05/11/2020, 06/10/2021, 05/08/2023   Influenza Split 06/10/2011   Influenza Whole 07/23/2010   Influenza,inj,Quad PF,6+ Mos 04/18/2018   Influenza-Unspecified 05/15/2013, 05/16/2014, 05/08/2017   PFIZER Comirnaty(Gray Top)Covid-19 Tri-Sucrose Vaccine 12/14/2020,  05/06/2022   PFIZER(Purple Top)SARS-COV-2 Vaccination 10/18/2019, 11/08/2019, 05/11/2020   Pfizer Covid-19 Vaccine Bivalent Booster 3yrs & up 06/10/2021   Pfizer(Comirnaty)Fall Seasonal Vaccine 12 years and older 05/08/2023   Pneumococcal Conjugate-13 12/06/2013   Pneumococcal Polysaccharide-23 09/23/2011   Td 05/18/2007   Tdap 10/31/2017   Zoster Recombinant(Shingrix) 11/09/2020, 03/17/2021   Zoster, Live 06/29/2012    Screening Tests Health Maintenance  Topic Date Due   Hepatitis C Screening  Never done   COVID-19 Vaccine (8 - 2024-25 season) 11/05/2023   INFLUENZA VACCINE  03/15/2024   MAMMOGRAM  04/11/2024   Medicare Annual Wellness (AWV)  04/08/2025   DTaP/Tdap/Td (3 - Td or Tdap) 11/01/2027   Pneumococcal Vaccine: 50+ Years  Completed   DEXA SCAN  Completed   Zoster Vaccines- Shingrix  Completed   HPV VACCINES  Aged Out   Meningococcal B Vaccine  Aged Out   Colonoscopy  Discontinued    Health Maintenance  Health Maintenance  Due  Topic Date Due   Hepatitis C Screening  Never done   COVID-19 Vaccine (8 - 2024-25 season) 11/05/2023   INFLUENZA VACCINE  03/15/2024   Health Maintenance Items Addressed:patient declined   Additional Screening:  Vision Screening: Recommended annual ophthalmology exams for early detection of glaucoma and other disorders of the eye. Would you like a referral to an eye doctor? No    Dental Screening: Recommended annual dental exams for proper oral hygiene  Community Resource Referral / Chronic Care Management: CRR required this visit?  No   CCM required this visit?  No   Plan:    I have personally reviewed and noted the following in the patient's chart:   Medical and social history Use of alcohol, tobacco or illicit drugs  Current medications and supplements including opioid prescriptions. Patient is not currently taking opioid prescriptions. Functional ability and status Nutritional status Physical activity Advanced  directives List of other physicians Hospitalizations, surgeries, and ER visits in previous 12 months Vitals Screenings to include cognitive, depression, and falls Referrals and appointments  In addition, I have reviewed and discussed with patient certain preventive protocols, quality metrics, and best practice recommendations. A written personalized care plan for preventive services as well as general preventive health recommendations were provided to patient.   Lyle MARLA Right, [place]   04/08/2024   After Visit Summary: (MyChart) Due to this being a telephonic visit, the after visit summary with patients personalized plan was offered to patient via MyChart   Notes: Nothing significant to report at this time.

## 2024-04-08 NOTE — Patient Instructions (Signed)
 Ms. Rutan , Thank you for taking time out of your busy schedule to complete your Annual Wellness Visit with me. I enjoyed our conversation and look forward to speaking with you again next year. I, as well as your care team,  appreciate your ongoing commitment to your health goals. Please review the following plan we discussed and let me know if I can assist you in the future. Your Game plan/ To Do List    Referrals: If you haven't heard from the office you've been referred to, please reach out to them at the phone provided.   Follow up Visits: We will see or speak with you next year for your Next Medicare AWV with our clinical staff Have you seen your provider in the last 6 months (3 months if uncontrolled diabetes)? Yes  Clinician Recommendations:  Aim for 30 minutes of exercise or brisk walking, 6-8 glasses of water, and 5 servings of fruits and vegetables each day.       This is a list of the screenings recommended for you:  Health Maintenance  Topic Date Due   Hepatitis C Screening  Never done   COVID-19 Vaccine (8 - 2024-25 season) 11/05/2023   Flu Shot  03/15/2024   Mammogram  04/11/2024   Medicare Annual Wellness Visit  04/08/2025   DTaP/Tdap/Td vaccine (3 - Td or Tdap) 11/01/2027   Pneumococcal Vaccine for age over 5  Completed   DEXA scan (bone density measurement)  Completed   Zoster (Shingles) Vaccine  Completed   HPV Vaccine  Aged Out   Meningitis B Vaccine  Aged Out   Colon Cancer Screening  Discontinued    Advanced directives: (Declined) Advance directive discussed with you today. Even though you declined this today, please call our office should you change your mind, and we can give you the proper paperwork for you to fill out. Advance Care Planning is important because it:  [x]  Makes sure you receive the medical care that is consistent with your values, goals, and preferences  [x]  It provides guidance to your family and loved ones and reduces their decisional  burden about whether or not they are making the right decisions based on your wishes.  Follow the link provided in your after visit summary or read over the paperwork we have mailed to you to help you started getting your Advance Directives in place. If you need assistance in completing these, please reach out to us  so that we can help you!  See attachments for Preventive Care and Fall Prevention Tips.

## 2024-04-12 ENCOUNTER — Ambulatory Visit
Admission: RE | Admit: 2024-04-12 | Discharge: 2024-04-12 | Disposition: A | Discharge status: Home / Self Care | Source: Ambulatory Visit | Attending: Family Medicine | Admitting: Family Medicine

## 2024-04-12 DIAGNOSIS — Z0000 Encounter for general adult medical examination without abnormal findings: Secondary | ICD-10-CM

## 2024-04-12 DIAGNOSIS — Z1231 Encounter for screening mammogram for malignant neoplasm of breast: Secondary | ICD-10-CM | POA: Diagnosis not present

## 2024-04-17 ENCOUNTER — Ambulatory Visit (INDEPENDENT_AMBULATORY_CARE_PROVIDER_SITE_OTHER): Admitting: Family Medicine

## 2024-04-17 ENCOUNTER — Ambulatory Visit: Payer: Self-pay | Admitting: Family Medicine

## 2024-04-17 ENCOUNTER — Encounter: Payer: Self-pay | Admitting: Family Medicine

## 2024-04-17 VITALS — BP 132/80 | HR 68 | Temp 98.2°F | Ht 59.0 in | Wt 159.5 lb

## 2024-04-17 DIAGNOSIS — I10 Essential (primary) hypertension: Secondary | ICD-10-CM

## 2024-04-17 DIAGNOSIS — E2839 Other primary ovarian failure: Secondary | ICD-10-CM

## 2024-04-17 DIAGNOSIS — Z1159 Encounter for screening for other viral diseases: Secondary | ICD-10-CM | POA: Diagnosis not present

## 2024-04-17 DIAGNOSIS — E876 Hypokalemia: Secondary | ICD-10-CM | POA: Diagnosis not present

## 2024-04-17 DIAGNOSIS — Z0000 Encounter for general adult medical examination without abnormal findings: Secondary | ICD-10-CM

## 2024-04-17 DIAGNOSIS — Z131 Encounter for screening for diabetes mellitus: Secondary | ICD-10-CM

## 2024-04-17 DIAGNOSIS — M8589 Other specified disorders of bone density and structure, multiple sites: Secondary | ICD-10-CM

## 2024-04-17 DIAGNOSIS — E7800 Pure hypercholesterolemia, unspecified: Secondary | ICD-10-CM | POA: Diagnosis not present

## 2024-04-17 DIAGNOSIS — E669 Obesity, unspecified: Secondary | ICD-10-CM | POA: Diagnosis not present

## 2024-04-17 MED ORDER — ROSUVASTATIN CALCIUM 5 MG PO TABS: 5.0000 mg | ORAL_TABLET | Freq: Every day | ORAL | 3 refills | Status: AC

## 2024-04-17 MED ORDER — HYDROCHLOROTHIAZIDE 25 MG PO TABS: 25.0000 mg | ORAL_TABLET | Freq: Every day | ORAL | 3 refills | Status: AC

## 2024-04-17 NOTE — Assessment & Plan Note (Signed)
 Dexa ordered

## 2024-04-17 NOTE — Progress Notes (Signed)
 Subjective:    Patient ID: Melanie Marshall, female    DOB: May 27, 1946, 78 y.o.   MRN: [id number]  HPI  Here for health maintenance exam and to review chronic medical problems   Wt Readings from Last 3 Encounters:  04/17/24 159 lb 8 oz (72.3 kg)  04/08/24 159 lb (72.1 kg)  04/11/23 159 lb 4 oz (72.2 kg)   32.22 kg/m  Vitals:   04/17/24 1358  BP: 132/80  Pulse: 68  Temp: 98.2 F (36.8 C)  SpO2: 98%    Immunization History  Administered Date(s) Administered   Fluad Quad(high Dose 65+) 04/25/2019, 05/06/2022   Hepatitis A 06/24/2008   INFLUENZA, HIGH DOSE SEASONAL PF 05/08/2015, 05/16/2016, 05/11/2020, 06/10/2021, 05/08/2023   Influenza Split 06/10/2011   Influenza Whole 07/23/2010   Influenza,inj,Quad PF,6+ Mos 04/18/2018   Influenza-Unspecified 05/15/2013, 05/16/2014, 05/08/2017   PFIZER Comirnaty(Gray Top)Covid-19 Tri-Sucrose Vaccine 12/14/2020, 05/06/2022   PFIZER(Purple Top)SARS-COV-2 Vaccination 10/18/2019, 11/08/2019, 05/11/2020   Pfizer Covid-19 Vaccine Bivalent Booster 34yrs & up 06/10/2021   Pfizer(Comirnaty)Fall Seasonal Vaccine 12 years and older 05/08/2023   Pneumococcal Conjugate-13 12/06/2013   Pneumococcal Polysaccharide-23 09/23/2011   Td 05/18/2007   Tdap 10/31/2017   Zoster Recombinant(Shingrix) 11/09/2020, 03/17/2021   Zoster, Live 06/29/2012    Health Maintenance Due  Topic Date Due   Hepatitis C Screening  Never done   Flu shot -will get at [facility]    Mammogram last month- has not been read yet  Self breast exam  Gyn health No problems  Sexually active-one partner  Uses vaginal lubricant   Colon cancer screening  Colonoscopy 12/2021   Bone health  Dexa  09/2021 at the breast center  osteopenia  Falls-none  Fractures-none  Supplements -vitamin D  3  Last vitamin D  Lab Results  Component Value Date   VD25OH 32 12/02/2013    Exercise  Walking     Mood    04/08/2024   11:54 AM 04/11/2023   11:24 AM 03/30/2023   12:33 PM  04/07/2022   11:07 AM 04/06/2022    8:53 AM  Depression screen PHQ 2/9  Decreased Interest 0 0 0 0 0  Down, Depressed, Hopeless 0 0 0 0 0  PHQ - 2 Score 0 0 0 0 0  Altered sleeping 0 1 0    Tired, decreased energy 0 0 0    Change in appetite 0 0 0    Feeling bad or failure about yourself  0 0 0    Trouble concentrating 0 0 0    Moving slowly or fidgety/restless 0 0 0    Suicidal thoughts 0 0 0    PHQ-9 Score 0 1 0    Difficult doing work/chores Not difficult at all Not difficult at all Not difficult at all     HTN bp is stable today  No cp or palpitations or headaches or edema  No side effects to medicines  BP Readings from Last 3 Encounters:  04/17/24 132/80  04/08/24 124/66  04/11/23 124/66    Hydrochlorothiazide  25 mg daily Klor con in past - pharmacy did not give it to her after last refill  Has had a few muscle cramps   Lab Results  Component Value Date   NA 140 04/05/2023   K 3.7 04/05/2023   CO2 27 04/05/2023   GLUCOSE 102 (H) 04/05/2023   BUN 32 (H) 04/05/2023   CREATININE 1.10 04/05/2023   CALCIUM  9.0 04/05/2023   GFR 48.75 (L) 04/05/2023   GFRNONAA 81.44  07/20/2010    Hyperlipidemia Lab Results  Component Value Date   CHOL 156 04/05/2023   HDL 45.30 04/05/2023   LDLCALC 81 04/05/2023   LDLDIRECT 145.0 03/30/2022   TRIG 151.0 (H) 04/05/2023   CHOLHDL 3 04/05/2023   Due for labs  Crestor  5 mg daily  No changes in diet  Eats healthy      Patient Active Problem List   Diagnosis Date Noted   Encounter for hepatitis C screening test for low risk patient 04/17/2024   Hypokalemia 04/01/2019   Essential hypertension 03/14/2017   Estrogen deficiency 03/09/2015   Routine general medical examination at a health care facility 03/01/2015   Hyperlipidemia 06/24/2008   ALLERGIC RHINITIS 04/17/2007   Osteopenia 04/17/2007   MURMUR 04/17/2007   MIGRAINES, HX OF 04/17/2007   Past Medical History:  Diagnosis Date   Allergy    allergic rhinitis    Arthritis    Heart murmur    not treated   History of blood transfusion 08/15/1968   after childbirth   Hyperlipidemia    Hypertension    Osteopenia    Past Surgical History:  Procedure Laterality Date   COLONOSCOPY     TUBAL LIGATION  08/15/1977   Social History   Tobacco Use   Smoking status: Former    Current packs/day: 0.00    Types: Cigarettes    Quit date: 08/16/1967    Years since quitting: 56.7   Smokeless tobacco: Never  Vaping Use   Vaping status: Never Used  Substance Use Topics   Alcohol use: Yes    Alcohol/week: 1.0 standard drink of alcohol    Types: 1 Glasses of wine per week    Comment: occ   Drug use: No   Family History  Problem Relation Age of Onset   Diabetes Mother    Heart disease Mother        MI   Hypertension Father        ??HTN   Hyperlipidemia Father    Pancreatic cancer Father    Cancer Father    Hypertension Brother    Cancer Brother        pancreastic (suspect)    Diabetes Brother    Breast cancer Maternal Grandmother    Cancer Paternal Grandmother        breast CA   Cancer Brother    COPD Brother    Diabetes Daughter    Diabetes Daughter    Colon cancer Neg Hx    Stomach cancer Neg Hx    Colon polyps Neg Hx    Esophageal cancer Neg Hx    Rectal cancer Neg Hx    No Known Allergies Current Outpatient Medications on File Prior to Visit  Medication Sig Dispense Refill   Cholecalciferol (VITAMIN D3) 2000 units TABS Take 1 tablet by mouth daily.     ibuprofen (ADVIL,MOTRIN) 200 MG tablet Take 200 mg by mouth every 6 (six) hours as needed.     Multiple Vitamins-Minerals (PRESERVISION AREDS PO) Take 1 tablet by mouth 2 (two) times daily.     potassium chloride  (KLOR-CON ) 10 MEQ tablet Take 1 tablet (10 mEq total) by mouth daily. 90 tablet 3   No current facility-administered medications on file prior to visit.    Review of Systems  Constitutional:  Negative for activity change, appetite change, fatigue, fever and unexpected  weight change.  HENT:  Negative for congestion, ear pain, rhinorrhea, sinus pressure and sore throat.   Eyes:  Negative for pain,  redness and visual disturbance.  Respiratory:  Negative for cough, shortness of breath and wheezing.   Cardiovascular:  Negative for chest pain and palpitations.  Gastrointestinal:  Negative for abdominal pain, blood in stool, constipation and diarrhea.  Endocrine: Negative for polydipsia and polyuria.  Genitourinary:  Negative for dysuria, frequency and urgency.  Musculoskeletal:  Negative for arthralgias, back pain and myalgias.       Few muscle cramps   Skin:  Negative for pallor and rash.  Allergic/Immunologic: Negative for environmental allergies.  Neurological:  Negative for dizziness, syncope and headaches.  Hematological:  Negative for adenopathy. Does not bruise/bleed easily.  Psychiatric/Behavioral:  Negative for decreased concentration and dysphoric mood. The patient is not nervous/anxious.        Objective:   Physical Exam Constitutional:      General: She is not in acute distress.    Appearance: Normal appearance. She is well-developed. She is obese. She is not ill-appearing or diaphoretic.  HENT:     Head: Normocephalic and atraumatic.     Right Ear: Tympanic membrane, ear canal and external ear normal.     Left Ear: Tympanic membrane, ear canal and external ear normal.     Nose: Nose normal. No congestion.     Mouth/Throat:     Mouth: Mucous membranes are moist.     Pharynx: Oropharynx is clear. No posterior oropharyngeal erythema.  Eyes:     General: No scleral icterus.    Extraocular Movements: Extraocular movements intact.     Conjunctiva/sclera: Conjunctivae normal.     Pupils: Pupils are equal, round, and reactive to light.  Neck:     Thyroid : No thyromegaly.     Vascular: No carotid bruit or JVD.  Cardiovascular:     Rate and Rhythm: Normal rate and regular rhythm.     Pulses: Normal pulses.     Heart sounds: Normal heart  sounds.     No gallop.  Pulmonary:     Effort: Pulmonary effort is normal. No respiratory distress.     Breath sounds: Normal breath sounds. No wheezing.     Comments: Good air exch Chest:     Chest wall: No tenderness.  Abdominal:     General: Bowel sounds are normal. There is no distension or abdominal bruit.     Palpations: Abdomen is soft. There is no mass.     Tenderness: There is no abdominal tenderness.     Hernia: No hernia is present.  Genitourinary:    Comments: Breast exam: No mass, nodules, thickening, tenderness, bulging, retraction, inflamation, nipple discharge or skin changes noted.  No axillary or clavicular LA.     Musculoskeletal:        General: No tenderness. Normal range of motion.     Cervical back: Normal range of motion and neck supple. No rigidity. No muscular tenderness.     Right lower leg: No edema.     Left lower leg: No edema.     Comments: No kyphosis   Lymphadenopathy:     Cervical: No cervical adenopathy.  Skin:    General: Skin is warm and dry.     Coloration: Skin is not pale.     Findings: No erythema or rash.     Comments: Solar lentigines diffusely  Few sks  Neurological:     Mental Status: She is alert. Mental status is at baseline.     Cranial Nerves: No cranial nerve deficit.     Motor: No abnormal muscle tone.  Coordination: Coordination normal.     Gait: Gait normal.     Deep Tendon Reflexes: Reflexes are normal and symmetric. Reflexes normal.  Psychiatric:        Mood and Affect: Mood normal.        Cognition and Memory: Cognition and memory normal.           Assessment & Plan:   Problem List Items Addressed This Visit       Cardiovascular and Mediastinum   Essential hypertension   bp in fair control at this time  BP Readings from Last 1 Encounters:  04/17/24 132/80   No changes needed Most recent labs reviewed  Disc lifstyle change with low sodium diet and exercise  Plan to continue hctz 25 mg daily  Pt not  taking klor con because pharmacy did not refill despite prescription being sent  Bmet today- will likely re start this   Watching renal numbers       Relevant Medications   hydrochlorothiazide  (HYDRODIURIL ) 25 MG tablet   rosuvastatin  (CRESTOR ) 5 MG tablet   Other Relevant Orders   TSH   Lipid Panel   Comprehensive metabolic panel with GFR   CBC with Differential/Platelet     Musculoskeletal and Integument   Osteopenia   Due for dexa Ordered at [facility] (breast center no longer doing them)   Discussed fall prevention, supplements and exercise for bone density  No falls or fractures         Other   Routine general medical examination at a health care facility - Primary   Hypokalemia   Bmet today  Off klor con because pharmacy did not give it to her (?)  If low will re start it       Hyperlipidemia   Disc goals for lipids and reasons to control them Rev last labs with pt Rev low sat fat diet in detail  Lab today  Continues crestor  5 mg daily  Good diet       Relevant Medications   hydrochlorothiazide  (HYDRODIURIL ) 25 MG tablet   rosuvastatin  (CRESTOR ) 5 MG tablet   Estrogen deficiency   Dexa ordered       Relevant Orders   DG Bone Density   Encounter for hepatitis C screening test for low risk patient   Hep C screen today      Relevant Orders   Hepatitis C antibody

## 2024-04-17 NOTE — Assessment & Plan Note (Signed)
Hep C screen today 

## 2024-04-17 NOTE — Assessment & Plan Note (Signed)
 bp in fair control at this time  BP Readings from Last 1 Encounters:  04/17/24 132/80   No changes needed Most recent labs reviewed  Disc lifstyle change with low sodium diet and exercise  Plan to continue hctz 25 mg daily  Pt not taking klor con because pharmacy did not refill despite prescription being sent  Bmet today- will likely re start this   Watching renal numbers

## 2024-04-17 NOTE — Assessment & Plan Note (Signed)
 Disc goals for lipids and reasons to control them Rev last labs with pt Rev low sat fat diet in detail  Lab today  Continues crestor  5 mg daily  Good diet

## 2024-04-17 NOTE — Assessment & Plan Note (Signed)
 Due for dexa Ordered at [facility] (breast center no longer doing them)   Discussed fall prevention, supplements and exercise for bone density  No falls or fractures

## 2024-04-17 NOTE — Assessment & Plan Note (Signed)
 Bmet today  Off klor con because pharmacy did not give it to her (?)  If low will re start it

## 2024-04-17 NOTE — Patient Instructions (Addendum)
 Keep walking Add some strength training to your routine, this is important for bone and brain health and can reduce your risk of falls and help your body use insulin properly and regulate weight  Light weights, exercise bands , and internet videos are a good way to start  Yoga (chair or regular), machines , floor exercises or a gym with machines are also good options     Labs today   You have an order for:  []   3D Mammogram  [x]   Bone Density     Please call for appointment:   [x]   [facility]  [address] [place] [address]  [phone]  []   [facility])   [address]  [place], [place] [address]  [phone]  []   [facility]      [address] [place], [place]        [phone]         []   [facility]  [address] [place], [place]  [phone]  []  [facility]   [address], [place] [address]  [phone]  []  [facility]  [address] [place], [place] [address] [phone]    Make sure to wear two piece clothing  No lotions powders or deodorants the day of the appointment Make sure to bring picture ID and insurance card.  Bring list of medications you are currently taking including any supplements.   Schedule your screening mammogram through MyChart!   Select [facility] imaging sites can now be scheduled through MyChart.  Log into your MyChart account.  Go to 'Visit' (or 'Appointments' if  on mobile App) --> Schedule an  Appointment  Under 'Select a Reason for Visit' choose the Mammogram  Screening option.  Complete the pre-visit questions  and select the time and place that  best fits your schedule

## 2024-04-18 ENCOUNTER — Ambulatory Visit: Payer: Self-pay | Admitting: Family Medicine

## 2024-04-18 LAB — CBC WITH DIFFERENTIAL/PLATELET
Basophils Absolute: 0 K/uL (ref 0.0–0.1)
Basophils Relative: 0.8 % (ref 0.0–3.0)
Eosinophils Absolute: 0.1 K/uL (ref 0.0–0.7)
Eosinophils Relative: 3 % (ref 0.0–5.0)
HCT: 38.6 % (ref 36.0–46.0)
Hemoglobin: 13 g/dL (ref 12.0–15.0)
Lymphocytes Relative: 40.9 % (ref 12.0–46.0)
Lymphs Abs: 2 K/uL (ref 0.7–4.0)
MCHC: 33.6 g/dL (ref 30.0–36.0)
MCV: 88 fl (ref 78.0–100.0)
Monocytes Absolute: 0.6 K/uL (ref 0.1–1.0)
Monocytes Relative: 12.6 % — ABNORMAL HIGH (ref 3.0–12.0)
Neutro Abs: 2 K/uL (ref 1.4–7.7)
Neutrophils Relative %: 42.7 % — ABNORMAL LOW (ref 43.0–77.0)
Platelets: 348 K/uL (ref 150.0–400.0)
RBC: 4.39 Mil/uL (ref 3.87–5.11)
RDW: 14 % (ref 11.5–15.5)
WBC: 4.8 K/uL (ref 4.0–10.5)

## 2024-04-18 LAB — LIPID PANEL
Cholesterol: 144 mg/dL (ref 0–200)
HDL: 48.3 mg/dL (ref >39.00)
LDL Cholesterol: 55 mg/dL (ref 0–99)
NonHDL: 95.83
Total CHOL/HDL Ratio: 3
Triglycerides: 203 mg/dL — ABNORMAL HIGH (ref 0.0–149.0)
VLDL: 40.6 mg/dL — ABNORMAL HIGH (ref 0.0–40.0)

## 2024-04-18 LAB — TSH: TSH: 1.5 u[IU]/mL (ref 0.35–5.50)

## 2024-04-18 LAB — COMPREHENSIVE METABOLIC PANEL WITH GFR
ALT: 14 U/L (ref 0–35)
AST: 17 U/L (ref 0–37)
Albumin: 4.6 g/dL (ref 3.5–5.2)
Alkaline Phosphatase: 44 U/L (ref 39–117)
BUN: 26 mg/dL — ABNORMAL HIGH (ref 6–23)
CO2: 30 meq/L (ref 19–32)
Calcium: 9.9 mg/dL (ref 8.4–10.5)
Chloride: 100 meq/L (ref 96–112)
Creatinine, Ser: 1.08 mg/dL (ref 0.40–1.20)
GFR: 49.47 mL/min — ABNORMAL LOW (ref >60.00)
Glucose, Bld: 114 mg/dL — ABNORMAL HIGH (ref 70–99)
Potassium: 3.7 meq/L (ref 3.5–5.1)
Sodium: 141 meq/L (ref 135–145)
Total Bilirubin: 0.4 mg/dL (ref 0.2–1.2)
Total Protein: 7.3 g/dL (ref 6.0–8.3)

## 2024-04-18 LAB — HEPATITIS C ANTIBODY: Hepatitis C Ab: NONREACTIVE

## 2024-04-18 LAB — HEMOGLOBIN A1C: Hgb A1c MFr Bld: 6.7 % — ABNORMAL HIGH (ref 4.6–6.5)

## 2024-06-03 ENCOUNTER — Ambulatory Visit
Admission: RE | Admit: 2024-06-03 | Discharge: 2024-06-03 | Disposition: A | Discharge status: Home / Self Care | Source: Ambulatory Visit | Attending: Family Medicine | Admitting: Family Medicine

## 2024-06-03 DIAGNOSIS — M8589 Other specified disorders of bone density and structure, multiple sites: Secondary | ICD-10-CM | POA: Diagnosis not present

## 2024-06-03 DIAGNOSIS — E2839 Other primary ovarian failure: Secondary | ICD-10-CM | POA: Insufficient documentation

## 2024-06-03 DIAGNOSIS — Z780 Asymptomatic menopausal state: Secondary | ICD-10-CM | POA: Diagnosis not present

## 2024-07-18 ENCOUNTER — Encounter: Payer: Self-pay | Admitting: Family Medicine

## 2024-07-18 ENCOUNTER — Ambulatory Visit: Payer: Self-pay | Admitting: Family Medicine

## 2024-07-18 ENCOUNTER — Telehealth: Payer: Self-pay | Admitting: Family Medicine

## 2024-07-18 ENCOUNTER — Other Ambulatory Visit (INDEPENDENT_AMBULATORY_CARE_PROVIDER_SITE_OTHER)

## 2024-07-18 DIAGNOSIS — R7303 Prediabetes: Secondary | ICD-10-CM | POA: Diagnosis not present

## 2024-07-18 LAB — HEMOGLOBIN A1C: Hgb A1c MFr Bld: 6.1 % (ref 4.6–6.5)

## 2024-07-18 NOTE — Telephone Encounter (Signed)
 Lab order

## 2024-07-23 ENCOUNTER — Encounter: Payer: Self-pay | Admitting: Family Medicine

## 2024-07-23 ENCOUNTER — Ambulatory Visit: Admitting: Family Medicine

## 2024-07-23 VITALS — BP 131/60 | HR 66 | Temp 97.5°F | Ht 59.0 in | Wt 158.4 lb

## 2024-07-23 DIAGNOSIS — N1831 Chronic kidney disease, stage 3a: Secondary | ICD-10-CM

## 2024-07-23 DIAGNOSIS — N183 Chronic kidney disease, stage 3 unspecified: Secondary | ICD-10-CM | POA: Insufficient documentation

## 2024-07-23 DIAGNOSIS — E876 Hypokalemia: Secondary | ICD-10-CM

## 2024-07-23 DIAGNOSIS — I10 Essential (primary) hypertension: Secondary | ICD-10-CM

## 2024-07-23 DIAGNOSIS — R7303 Prediabetes: Secondary | ICD-10-CM

## 2024-07-23 NOTE — Patient Instructions (Addendum)
 Keep walking  In addition to regular activity    Add more strength training  (aim for 10-20 minutes 3 times per week)  Add some strength training to your routine, this is important for bone and brain health and can reduce your risk of falls and help your body use insulin properly and regulate weight  Light weights, exercise bands , and internet videos are a good way to start  Yoga (chair or regular), machines , floor exercises or a gym with machines are also good options    Continue the low glycemic diet    Make sure to get at least 48 oz fluid daily /mostly water

## 2024-07-23 NOTE — Assessment & Plan Note (Signed)
 Improved commended Lab Results  Component Value Date   HGBA1C 6.1 07/18/2024   HGBA1C 6.7 (H) 04/17/2024   Encouraged to keep up the good work with low glycemic diet  Aim for more exercise  30 min 5 d weekly  Add more muscle building activity- reviewed options

## 2024-07-23 NOTE — Assessment & Plan Note (Signed)
 bp in fair control at this time  BP Readings from Last 1 Encounters:  07/23/24 131/60   No changes needed Most recent labs reviewed  Disc lifstyle change with low sodium diet and exercise  Plan to continue hctz 25 mg daily  Watching CKD Good fluid intake   Blood pressure at home similar to here  Plans to start more exercise

## 2024-07-23 NOTE — Assessment & Plan Note (Signed)
 Last GFR 49.47-improved  On hydrochlorothiazide   Continue to monitor   Blood sugar is improved   Encouraged at least 48 oz fluid daily

## 2024-07-23 NOTE — Progress Notes (Signed)
 Subjective:    Patient ID: Melanie Marshall, female    DOB: [birth year], 78 y.o.   MRN: [id number]  HPI  Wt Readings from Last 3 Encounters:  07/23/24 158 lb 6 oz (71.8 kg)  04/17/24 159 lb 8 oz (72.3 kg)  04/08/24 159 lb (72.1 kg)   31.99 kg/m  Vitals:   07/23/24 0957 07/23/24 1014  BP: (!) 140/78 131/60  Pulse: 66   Temp: (!) 97.5 F (36.4 C)   SpO2: 99%     Pt presents for follow up of  Prediabetes  HTN  bp is stable today  No cp or palpitations or headaches or edema  No side effects to medicines  BP Readings from Last 3 Encounters:  07/23/24 131/60  04/17/24 132/80  04/08/24 124/66    Blood pressure at home has been good  130s/ 60s    Lab Results  Component Value Date   NA 141 04/17/2024   K 3.7 04/17/2024   CO2 30 04/17/2024   GLUCOSE 114 (H) 04/17/2024   BUN 26 (H) 04/17/2024   CREATININE 1.08 04/17/2024   CALCIUM  9.9 04/17/2024   GFR 49.47 (L) 04/17/2024   GFRNONAA 81.44 07/20/2010   Hydrochlorothiazide  25 mg daily  No longer needing klor con   CKD 3a GFR 49.4    (up from 48.7)  Overall stable   Is a very good water drinker     Prediabetes  Lab Results  Component Value Date   HGBA1C 6.1 07/18/2024   HGBA1C 6.7 (H) 04/17/2024  Last A1c was up into the diabetic range   Now improved   Eating better  Decreased bread and pasta  Is a challenge   Limiting sweets- trying to limit them  When she eats less she craves less   No sugar drinks   Alcohol seldom- once per week , one wine or beer   Eating more greens   Does not feel different    Not a lot of exercise  Walking - being active  Has some weights    Could set aside some time      Patient Active Problem List   Diagnosis Date Noted   CKD (chronic kidney disease) stage 3, GFR 30-59 ml/min (HCC) 07/23/2024   Prediabetes 07/18/2024   Encounter for hepatitis C screening test for low risk patient 04/17/2024   Diabetes mellitus screening 04/17/2024   Obesity (BMI  30-39.9) 04/17/2024   Hypokalemia 04/01/2019   Essential hypertension 03/14/2017   Estrogen deficiency 03/09/2015   Routine general medical examination at a health care facility 03/01/2015   Hyperlipidemia 06/24/2008   ALLERGIC RHINITIS 04/17/2007   Osteopenia 04/17/2007   MURMUR 04/17/2007   MIGRAINES, HX OF 04/17/2007   Past Medical History:  Diagnosis Date   Allergy    allergic rhinitis   Arthritis    CKD (chronic kidney disease) stage 3, GFR 30-59 ml/min (HCC) 07/23/2024   Heart murmur    not treated   History of blood transfusion 08/15/1968   after childbirth   Hyperlipidemia    Hypertension    Osteopenia    Past Surgical History:  Procedure Laterality Date   COLONOSCOPY     TUBAL LIGATION  08/15/1977   Social History   Tobacco Use   Smoking status: Former    Current packs/day: 0.00    Types: Cigarettes    Quit date: 08/16/1967    Years since quitting: 56.9   Smokeless tobacco: Never  Vaping Use   Vaping status: Never Used  Substance Use Topics   Alcohol use: Yes    Alcohol/week: 1.0 standard drink of alcohol    Types: 1 Glasses of wine per week    Comment: occ   Drug use: No   Family History  Problem Relation Age of Onset   Diabetes Mother    Heart disease Mother        MI   Hypertension Father        ??HTN   Hyperlipidemia Father    Pancreatic cancer Father    Cancer Father    Hypertension Brother    Cancer Brother        pancreastic (suspect)    Diabetes Brother    Breast cancer Maternal Grandmother    Cancer Paternal Grandmother        breast CA   Cancer Brother    COPD Brother    Diabetes Daughter    Diabetes Daughter    Colon cancer Neg Hx    Stomach cancer Neg Hx    Colon polyps Neg Hx    Esophageal cancer Neg Hx    Rectal cancer Neg Hx    No Known Allergies Current Outpatient Medications on File Prior to Visit  Medication Sig Dispense Refill   Cholecalciferol (VITAMIN D3) 2000 units TABS Take 1 tablet by mouth daily.      hydrochlorothiazide  (HYDRODIURIL ) 25 MG tablet Take 1 tablet (25 mg total) by mouth daily. 90 tablet 3   ibuprofen (ADVIL,MOTRIN) 200 MG tablet Take 200 mg by mouth every 6 (six) hours as needed.     Multiple Vitamins-Minerals (PRESERVISION AREDS PO) Take 1 tablet by mouth 2 (two) times daily.     rosuvastatin  (CRESTOR ) 5 MG tablet Take 1 tablet (5 mg total) by mouth daily. 90 tablet 3   No current facility-administered medications on file prior to visit.    Review of Systems  Constitutional:  Negative for activity change, appetite change, fatigue, fever and unexpected weight change.  HENT:  Negative for congestion, ear pain, rhinorrhea, sinus pressure and sore throat.   Eyes:  Negative for pain, redness and visual disturbance.  Respiratory:  Negative for cough, shortness of breath and wheezing.   Cardiovascular:  Negative for chest pain and palpitations.  Gastrointestinal:  Negative for abdominal pain, blood in stool, constipation and diarrhea.  Endocrine: Negative for polydipsia and polyuria.  Genitourinary:  Negative for dysuria, frequency and urgency.  Musculoskeletal:  Negative for arthralgias, back pain and myalgias.  Skin:  Negative for pallor and rash.  Allergic/Immunologic: Negative for environmental allergies.  Neurological:  Negative for dizziness, syncope and headaches.  Hematological:  Negative for adenopathy. Does not bruise/bleed easily.  Psychiatric/Behavioral:  Negative for decreased concentration and dysphoric mood. The patient is not nervous/anxious.        Objective:   Physical Exam Constitutional:      General: She is not in acute distress.    Appearance: Normal appearance. She is well-developed. She is obese. She is not ill-appearing or diaphoretic.  HENT:     Head: Normocephalic and atraumatic.  Eyes:     Conjunctiva/sclera: Conjunctivae normal.     Pupils: Pupils are equal, round, and reactive to light.  Neck:     Thyroid : No thyromegaly.     Vascular: No  carotid bruit or JVD.  Cardiovascular:     Rate and Rhythm: Normal rate and regular rhythm.     Heart sounds: Murmur heard.     No gallop.  Pulmonary:     Effort: Pulmonary  effort is normal. No respiratory distress.     Breath sounds: Normal breath sounds. No wheezing or rales.  Abdominal:     General: There is no distension or abdominal bruit.     Palpations: Abdomen is soft.  Musculoskeletal:     Cervical back: Normal range of motion and neck supple.     Right lower leg: No edema.     Left lower leg: No edema.  Lymphadenopathy:     Cervical: No cervical adenopathy.  Skin:    General: Skin is warm and dry.     Coloration: Skin is not pale.     Findings: No rash.  Neurological:     Mental Status: She is alert.     Coordination: Coordination normal.     Deep Tendon Reflexes: Reflexes are normal and symmetric. Reflexes normal.  Psychiatric:        Mood and Affect: Mood normal.           Assessment & Plan:   Problem List Items Addressed This Visit       Cardiovascular and Mediastinum   Essential hypertension   bp in fair control at this time  BP Readings from Last 1 Encounters:  07/23/24 131/60   No changes needed Most recent labs reviewed  Disc lifstyle change with low sodium diet and exercise  Plan to continue hctz 25 mg daily  Watching CKD Good fluid intake   Blood pressure at home similar to here  Plans to start more exercise         Genitourinary   CKD (chronic kidney disease) stage 3, GFR 30-59 ml/min (HCC)   Last GFR 49.47-improved  On hydrochlorothiazide   Continue to monitor   Blood sugar is improved   Encouraged at least 48 oz fluid daily         Other   Prediabetes - Primary   Improved commended Lab Results  Component Value Date   HGBA1C 6.1 07/18/2024   HGBA1C 6.7 (H) 04/17/2024   Encouraged to keep up the good work with low glycemic diet  Aim for more exercise  30 min 5 d weekly  Add more muscle building activity- reviewed  options       Hypokalemia   Stopped K after last labs  Normal without supplementation   Lab Results  Component Value Date   K 3.7 04/17/2024

## 2024-07-23 NOTE — Assessment & Plan Note (Signed)
 Stopped K after last labs  Normal without supplementation   Lab Results  Component Value Date   K 3.7 04/17/2024

## 2025-04-10 ENCOUNTER — Ambulatory Visit

## 2025-04-11 ENCOUNTER — Ambulatory Visit
